# Patient Record
Sex: Female | Born: 1965
Health system: Southern US, Community
[De-identification: ages and names within clinical notes are randomized; demographics above are authoritative.]

## PROBLEM LIST (undated history)

## (undated) DIAGNOSIS — E049 Nontoxic goiter, unspecified: Secondary | ICD-10-CM

## (undated) DIAGNOSIS — E119 Type 2 diabetes mellitus without complications: Secondary | ICD-10-CM

## (undated) HISTORY — DX: Type 2 diabetes mellitus without complications: E11.9

## (undated) HISTORY — DX: Nontoxic goiter, unspecified: E04.9

---

## 2020-05-05 ENCOUNTER — Encounter: Payer: Self-pay | Admitting: Family Medicine

## 2020-05-05 DIAGNOSIS — Z0289 Encounter for other administrative examinations: Secondary | ICD-10-CM | POA: Insufficient documentation

## 2020-05-07 ENCOUNTER — Ambulatory Visit (INDEPENDENT_AMBULATORY_CARE_PROVIDER_SITE_OTHER): Payer: Medicaid Other | Admitting: Family Medicine

## 2020-05-07 ENCOUNTER — Other Ambulatory Visit: Payer: Self-pay

## 2020-05-07 VITALS — BP 122/82 | HR 81 | Ht 60.5 in | Wt 133.8 lb

## 2020-05-07 DIAGNOSIS — Z0289 Encounter for other administrative examinations: Secondary | ICD-10-CM

## 2020-05-07 DIAGNOSIS — E119 Type 2 diabetes mellitus without complications: Secondary | ICD-10-CM | POA: Diagnosis not present

## 2020-05-07 DIAGNOSIS — E049 Nontoxic goiter, unspecified: Secondary | ICD-10-CM

## 2020-05-07 DIAGNOSIS — Z1211 Encounter for screening for malignant neoplasm of colon: Secondary | ICD-10-CM | POA: Diagnosis not present

## 2020-05-07 DIAGNOSIS — E1140 Type 2 diabetes mellitus with diabetic neuropathy, unspecified: Secondary | ICD-10-CM | POA: Insufficient documentation

## 2020-05-07 LAB — POCT URINALYSIS DIP (MANUAL ENTRY)
Bilirubin, UA: NEGATIVE
Blood, UA: NEGATIVE
Glucose, UA: 250 mg/dL — AB
Ketones, POC UA: NEGATIVE mg/dL
Nitrite, UA: NEGATIVE
Protein Ur, POC: NEGATIVE mg/dL
Spec Grav, UA: 1.025 (ref 1.010–1.025)
Urobilinogen, UA: 0.2 E.U./dL
pH, UA: 5.5 (ref 5.0–8.0)

## 2020-05-07 LAB — POCT UA - MICROSCOPIC ONLY

## 2020-05-07 LAB — POCT GLYCOSYLATED HEMOGLOBIN (HGB A1C): HbA1c, POC (controlled diabetic range): 9.8 % — AB (ref 0.0–7.0)

## 2020-05-07 MED ORDER — DAPAGLIFLOZIN PROPANEDIOL 5 MG PO TABS: 5.0000 mg | ORAL_TABLET | Freq: Every day | ORAL | 1 refills | Status: DC

## 2020-05-07 MED ORDER — METFORMIN HCL ER 500 MG PO TB24: 500.0000 mg | ORAL_TABLET | Freq: Every day | ORAL | 1 refills | Status: DC

## 2020-05-07 NOTE — Progress Notes (Signed)
Patient Name: Shelley Bartlett Date of Birth: 1965/11/18 Date of Visit: 05/07/20 PCP: Cleophas Dunker, DO  Chief Complaint: refugee intake examination and diabetes, thyroid pain   The patient's preferred language is Swahili. An interpreter was used for the entire visit.  Interpreter Name or ID: Jocelyne, in-person interpretor    Subjective: Shelley Bartlett is a pleasant 54 y.o. presenting today for an initial refugee and immigrant clinic visit.    ROS: + Foot pain, thyroid pain, eye pain.  No fevers, chills, weight change.  No cough, shortness of breath, chest pain.  No diarrhea, vomiting.  No rashes.     PMH: T2DM, diagnosed 7 years ago, A1c toay 9.8, at refugee health exam was 13.2 Has been taking Daonil '5mg'$  BID, Metformin 875 mg BID Has tolerated metformin well, just gets a little dizzy with it Has a glucometer, does not check sugars regularly No hypoglycemia No polyuria, polydipsia  Goiter, has been enlarged 3 years No pain, no difficulty swallowing No changes in voice No excessive sweating, no excessive fatigue Occasionally tired, but not all the time No dry skin or brittle hair    Eye Problem: States that she needed surgery but isn't sure what for Reports history of poor vision  Does not think she has ever had a pap  PSH: None   FH: Denies family history of medical problems in parents or children  Current Medications:  Metformin 875 BID Daonil '5mg'$  BID   Refugee Information Number of Immediate Family Members: 6 Number of Immediate Family Members in [place]: 6 Date of Arrival: 04/25/20 Country of Birth: [place] of Origin: [place]: Other Other Location of [place]:: [place] Duration in [place]: 20 years or greater Reason for [place]: Other Other Reason for [place]:: [place] Primary Language: Swahili/Kiswahili Able to Read in Primary Language: Yes Able to Write in Primary Language:  Yes Education: [employer] Prior Work: Medical sales representative Marital Status: Married Sexual Activity: Yes Tuberculosis Midwife: Not Completed Tuberculosis Screening Health Department: Not Completed Health Department Labs Completed: No Do You Feel Jumpy or Nervous?: No Are You Very Watchful or 'Super Alert'?: No  Date of [place] Exam: 07/11/2019 Review of [place] Exam: 05/07/2020 Pre-Departure Treatment: See attestation    Vitals:   05/07/20 0950  BP: 122/82  Pulse: 81  SpO2: 100%   HEENT: Sclera anicteric. Dentition is poor . Appears well hydrated. Neck: Supple, diffuse, non-tender thyromegaly, no cervical LAD Cardiac: Regular rate and rhythm. Normal S1/S2. No murmurs, rubs, or gallops appreciated. Lungs: Clear bilaterally to ascultation.  Abdomen: Normoactive bowel sounds. No tenderness to deep or light palpation. No rebound or guarding. No Splenomegaly.  Extremities: Warm, well perfused without edema.  Skin: warm, dry  Psych: Pleasant and appropriate  MSK: moving all extremities equally, no edema   [name] was seen today for establish care.  Diagnoses and all orders for this visit:  Refugee health examination -     POCT urinalysis dipstick -     RPR -     HIV Antibody (routine testing w rflx) -     Hepatitis c antibody (reflex) -     CBC with Differential/Platelet -     Comprehensive metabolic panel -     Varicella zoster antibody, IgG -     Hepatitis B surface antigen -     Hepatitis B core antibody, total -     Hepatitis B surface antibody,quantitative -     Lipid panel -  TSH -     HCV Ab w Reflex to Quant PCR -     Hgb Fractionation Cascade -     Cancel: Strongyloides antibody -     Strongyloides, Ab, IgG -     POCT UA - Microscopic Only  Type 2 diabetes mellitus without complication, unspecified whether long term insulin use (HCC) -     POCT glycosylated hemoglobin (Hb A1C) -     POCT UA - Microscopic Only -     Ambulatory referral to Ophthalmology -      metFORMIN (GLUCOPHAGE-XR) 500 MG 24 hr tablet; Take 1 tablet (500 mg total) by mouth daily with breakfast. -     dapagliflozin propanediol (FARXIGA) 5 MG TABS tablet; Take 1 tablet (5 mg total) by mouth daily.  Goiter  Screen for colon cancer -     Ambulatory referral to Gastroenterology   Type 2 diabetes mellitus without complication (HCC) R0Q today 9.8.  UA with 250 glucose.  Has been on Metformin 875 mg BID and sulfonylurea.  Preferred for medicaid is farxiga or jardiance.  Will start with faxiga '5mg'$  QD and start metformin XR '500mg'$  as she has dizziness with 875.  If able, will titrate up.  Can also titrate farxiga to '10mg'$  if needed.  BMP checked today with refugee labs.  Return in 1 week for repeat BMP and CBG follow up.  Advised to discontinue SGLT2 inhibitor use when having GI illness and to return to care if symptoms of yeast infection.  Patient has a glucometer.  Referral placed to ophthalmology.  Will send message to social worker to help with scheduling appointment.  Plan for foot exam in the future.  Goiter Noted on exam and seen on history.  TSH 0.56, T3 4.5, T4 15.4 at [facility] exam, all WNL.  Will perform POC [place] at visit in 1 week then order formal [place].  Will also repeat TSH today with refugee health labs.  Refugee health examination History reviewed and updated.  Refugee health information also updated.  Will need to assess history of trauma at follow up visit when patient is alone, as her husband was present for entirety of examination.  Labs obtained.  Needs to go to health department for TB testing.  Immunizations at follow up visit.  Screen for colon cancer Colonoscopy ordered.  Will send information to social worker to help with scheduling for patient.     Return to care in 1 week in [facility] with PCP.  Patient will need a pap smear as well for health maintenance at a follow up visit.  Vaccines: At follow up visit   Labs to Order if not completed at Health Department:   -  CBC with differential (looking for eosinophilia) - CMET  - HIV - Hepatitis C for adults, + tattoo, substance use, other risk factors  - RPR (those 15 and over)  - Varicella IgG if 19 years or older, unless immunization documented - MMR titer (unless prior vaccination)  - Hepatitis B surface antigen, surface antibody, and core antibody  - GC/CT if sexually active or risk factors  - Quantiferon or TST  - Lipids - HbA1C if overweight or obese  - B12 if [place] or [place]  - Sickle cell screen  - Urine dipstick  - Age 87-16, lead (repeat 3-6 months later)

## 2020-05-07 NOTE — Assessment & Plan Note (Signed)
Colonoscopy ordered.  Will send information to social worker to help with scheduling for patient.

## 2020-05-07 NOTE — Patient Instructions (Addendum)
Thank you for coming to see me today. It was a pleasure to meet you. Today we talked about:   Your diabetes: we will continue your metformin and start you on Farxiga 5 mg.  Come back in 4-6 weeks with a list of your blood sugars and we will repeat labs at that time.  You should stop taking this medication if you get sick with vomiting or diarrhea and resume it when you feel better again.  Check your sugars at least once a day (preferably in the morning).  You should go to the health department for your TB testing.  Your social worker will help you schedule your colonoscopy and your eye doctor.  Please follow-up with me in 1 week.  If you have any questions or concerns, please do not hesitate to call the office at [phone].  Best,   Luis Abed, DO

## 2020-05-07 NOTE — Assessment & Plan Note (Addendum)
History reviewed and updated.  Refugee health information also updated.  Will need to assess history of trauma at follow up visit when patient is alone, as her husband was present for entirety of examination.  Labs obtained.  Needs to go to health department for TB testing.  Immunizations at follow up visit.

## 2020-05-07 NOTE — Assessment & Plan Note (Addendum)
Noted on exam and seen on history.  TSH 0.56, T3 4.5, T4 15.4 at [facility] exam, all WNL.  Will perform POC [place] at visit in 1 week then order formal [place].  Will also repeat TSH today with refugee health labs.

## 2020-05-07 NOTE — Assessment & Plan Note (Addendum)
A1c today 9.8.  UA with 250 glucose.  Has been on Metformin 875 mg BID and sulfonylurea.  Preferred for medicaid is farxiga or jardiance.  Will start with faxiga 5mg  QD and start metformin XR 500mg  as she has dizziness with 875.  If able, will titrate up.  Can also titrate farxiga to 10mg  if needed.  BMP checked today with refugee labs.  Return in 1 week for repeat BMP and CBG follow up.  Advised to discontinue SGLT2 inhibitor use when having GI illness and to return to care if symptoms of yeast infection.  Patient has a glucometer.  Referral placed to ophthalmology.  Will send message to social worker to help with scheduling appointment.  Plan for foot exam in the future.

## 2020-05-09 LAB — CBC WITH DIFFERENTIAL/PLATELET
Basophils Absolute: 0 10*3/uL (ref 0.0–0.2)
Basos: 0 %
EOS (ABSOLUTE): 0 10*3/uL (ref 0.0–0.4)
Eos: 0 %
Hematocrit: 41.9 % (ref 34.0–46.6)
Hemoglobin: 13.5 g/dL (ref 11.1–15.9)
Immature Grans (Abs): 0 10*3/uL (ref 0.0–0.1)
Immature Granulocytes: 0 %
Lymphocytes Absolute: 2 10*3/uL (ref 0.7–3.1)
Lymphs: 40 %
MCH: 26.8 pg (ref 26.6–33.0)
MCHC: 32.2 g/dL (ref 31.5–35.7)
MCV: 83 fL (ref 79–97)
Monocytes Absolute: 0.4 10*3/uL (ref 0.1–0.9)
Monocytes: 7 %
Neutrophils Absolute: 2.6 10*3/uL (ref 1.4–7.0)
Neutrophils: 53 %
Platelets: 170 10*3/uL (ref 150–450)
RBC: 5.03 x10E6/uL (ref 3.77–5.28)
RDW: 13.8 % (ref 11.7–15.4)
WBC: 5 10*3/uL (ref 3.4–10.8)

## 2020-05-09 LAB — COMPREHENSIVE METABOLIC PANEL
ALT: 23 IU/L (ref 0–32)
AST: 19 IU/L (ref 0–40)
Albumin/Globulin Ratio: 1.7 (ref 1.2–2.2)
Albumin: 4.4 g/dL (ref 3.8–4.9)
Alkaline Phosphatase: 85 IU/L (ref 48–121)
BUN/Creatinine Ratio: 20 (ref 9–23)
BUN: 12 mg/dL (ref 6–24)
Bilirubin Total: 0.3 mg/dL (ref 0.0–1.2)
CO2: 24 mmol/L (ref 20–29)
Calcium: 9.3 mg/dL (ref 8.7–10.2)
Chloride: 100 mmol/L (ref 96–106)
Creatinine, Ser: 0.6 mg/dL (ref 0.57–1.00)
GFR calc Af Amer: 121 mL/min/{1.73_m2} (ref >59)
GFR calc non Af Amer: 105 mL/min/{1.73_m2} (ref >59)
Globulin, Total: 2.6 g/dL (ref 1.5–4.5)
Glucose: 234 mg/dL — ABNORMAL HIGH (ref 65–99)
Potassium: 4.4 mmol/L (ref 3.5–5.2)
Sodium: 139 mmol/L (ref 134–144)
Total Protein: 7 g/dL (ref 6.0–8.5)

## 2020-05-09 LAB — HGB FRACTIONATION CASCADE
Hgb A2: 2.6 % (ref 1.8–3.2)
Hgb A: 96.8 % (ref 96.4–98.8)
Hgb F: 0.6 % (ref 0.0–2.0)
Hgb S: 0 %

## 2020-05-09 LAB — HEPATITIS B SURFACE ANTIBODY, QUANTITATIVE: Hepatitis B Surf Ab Quant: >1000 m[IU]/mL (ref >9.9)

## 2020-05-09 LAB — LIPID PANEL
Chol/HDL Ratio: 3.5 ratio (ref 0.0–4.4)
Cholesterol, Total: 166 mg/dL (ref 100–199)
HDL: 47 mg/dL (ref >39)
LDL Chol Calc (NIH): 103 mg/dL — ABNORMAL HIGH (ref 0–99)
Triglycerides: 84 mg/dL (ref 0–149)
VLDL Cholesterol Cal: 16 mg/dL (ref 5–40)

## 2020-05-09 LAB — HEPATITIS B CORE ANTIBODY, TOTAL: Hep B Core Total Ab: NEGATIVE

## 2020-05-09 LAB — HCV AB W REFLEX TO QUANT PCR: HCV Ab: <0.1 s/co ratio (ref 0.0–0.9)

## 2020-05-09 LAB — HCV INTERPRETATION

## 2020-05-09 LAB — RPR: RPR Ser Ql: NONREACTIVE

## 2020-05-09 LAB — HEPATITIS B SURFACE ANTIGEN: Hepatitis B Surface Ag: NEGATIVE

## 2020-05-09 LAB — VARICELLA ZOSTER ANTIBODY, IGG: Varicella zoster IgG: 158 index — ABNORMAL LOW (ref >165)

## 2020-05-09 LAB — TSH: TSH: 0.913 u[IU]/mL (ref 0.450–4.500)

## 2020-05-09 LAB — HIV ANTIBODY (ROUTINE TESTING W REFLEX): HIV Screen 4th Generation wRfx: NONREACTIVE

## 2020-05-10 LAB — STRONGYLOIDES, AB, IGG: Strongyloides, Ab, IgG: NEGATIVE

## 2020-05-13 NOTE — Progress Notes (Signed)
    SUBJECTIVE:   CHIEF COMPLAINT / HPI:   DM Patient was prescribed Farxiga and Metformin XR 500 mg at last visit She has been taking Metformin, did not have Medicaid card with her and Marcelline Deist was going to be $600 She has been tolerating metformin well  CBGs has not checked BMP was within normal limits  Varicella nonimmune Patient is not immune to varicella, she will need a varicella vaccination Not a problem for patient to get vaccination  Goiter Patient has a history of goiter TSH was within normal limits She is asymptomatic  Foot pain 1 year Soaking her legs and feet in warm water helps They hurt all the time When she wears socks it helps with pain Wearing closed toe shoes makes the pain worse, wears sandals Pain is mostly in the balls of her feet  No specific time of day Describes the pain as burning pain Hard for her to walk on cement  iPad [place]il interpretor used for entirety of encounter  PERTINENT  PMH / PSH: T2DM, recent refugee  OBJECTIVE:   BP 120/76   Pulse 83   Ht 5' 0.5" (1.537 m)   Wt 135 lb (61.2 kg)   SpO2 98%   BMI 25.93 kg/m    Physical Exam:  General: 54 y.o. female in NAD Neck: Visible goiter Lungs: Breathing comfortably on room air Skin: warm and dry Extremities: No edema  Diabetic Foot Check -  Appearance - no lesions, ulcers or calluses Skin - no unusual pallor or redness Monofilament testing - WNL aside from Decreased sensation to monofilament testing on plantar aspect of right foot along all points at MTP joints, on left, decreased sensation to monofilament testing only at 1st MTP Right - Great toe, medial, central, lateral ball and posterior foot intact Left - Great toe, medial, central, lateral ball and posterior foot intact     ASSESSMENT/PLAN:   Type 2 diabetes mellitus with diabetic neuropathy, without long-term current use of insulin (HCC) Asked patient to check CBGs every morning.  Given that she has been without  [place] and her A1c was 9.8 at last visit, will go ahead and increase Metformin to 1000 mg daily.  Patient instructed to take 2 tablets daily.  Have been in contact with social worker and advised that patients should take Medicaid card to the pharmacy so that prior authorization can be started for [place].  Come back on 8/2.  Patient's foot pain is likely consistent with diabetic neuropathy given the consistency, although it is difficult to get a history.  She has decreased sensation in the areas as well which is likely related to diabetic neuropathy.  We will start with gabapentin 100 mg twice daily, can increase as patient tolerates and needs.  Goiter TSH within normal limits.  We will go ahead and order ultrasound for patient.  This was scheduled and patient was given date and time with instructions on how to get there.  Unknown varicella vaccination status Varicella nonimmune on initial refugee labs.  Patient will need varicella immunization.  Plan for immunizations at next visit.     Unknown Jim, DO [facility]

## 2020-05-14 ENCOUNTER — Encounter: Payer: Self-pay | Admitting: Family Medicine

## 2020-05-14 ENCOUNTER — Ambulatory Visit (INDEPENDENT_AMBULATORY_CARE_PROVIDER_SITE_OTHER): Payer: Medicaid Other | Admitting: Family Medicine

## 2020-05-14 ENCOUNTER — Other Ambulatory Visit: Payer: Self-pay

## 2020-05-14 ENCOUNTER — Telehealth: Payer: Self-pay | Admitting: *Deleted

## 2020-05-14 VITALS — BP 120/76 | HR 83 | Ht 60.5 in | Wt 135.0 lb

## 2020-05-14 DIAGNOSIS — Z789 Other specified health status: Secondary | ICD-10-CM | POA: Diagnosis not present

## 2020-05-14 DIAGNOSIS — E1140 Type 2 diabetes mellitus with diabetic neuropathy, unspecified: Secondary | ICD-10-CM | POA: Diagnosis not present

## 2020-05-14 DIAGNOSIS — E119 Type 2 diabetes mellitus without complications: Secondary | ICD-10-CM | POA: Diagnosis present

## 2020-05-14 DIAGNOSIS — E049 Nontoxic goiter, unspecified: Secondary | ICD-10-CM | POA: Diagnosis not present

## 2020-05-14 MED ORDER — METFORMIN HCL ER 500 MG PO TB24: 1000.0000 mg | ORAL_TABLET | Freq: Every day | ORAL | 2 refills | Status: DC

## 2020-05-14 MED ORDER — GABAPENTIN 100 MG PO CAPS: 100.0000 mg | ORAL_CAPSULE | Freq: Two times a day (BID) | ORAL | 3 refills | Status: DC

## 2020-05-14 NOTE — Telephone Encounter (Signed)
Prior approval for [place] completed via [employer].  Med approved for 05/14/2020 - 05/14/2021.  [facility] pharmacy informed via VM.  Jone Baseman, CMA

## 2020-05-14 NOTE — Assessment & Plan Note (Signed)
TSH within normal limits.  We will go ahead and order ultrasound for patient.  This was scheduled and patient was given date and time with instructions on how to get there.

## 2020-05-14 NOTE — Assessment & Plan Note (Signed)
Varicella nonimmune on initial refugee labs.  Patient will need varicella immunization.  Plan for immunizations at next visit.

## 2020-05-14 NOTE — Patient Instructions (Addendum)
Thank you for coming to see me today. It was a pleasure. Today we talked about:   We are working on getting one of your diabetes medications for you, Marcelline Deist.    Start taking two tablets of metformin once a day.  Please start checking your blood sugars every morning with your monitor.  You can take gabapentin for your foot pain.  Take it twice a day.  If it isn't helping enough, but you aren't getting too sleepy, we can increase it.  Please follow-up with me on 8/2 at 1:30pm.  If you have any questions or concerns, please do not hesitate to call the office at [phone].  Best,   Luis Abed, DO

## 2020-05-14 NOTE — Telephone Encounter (Signed)
Completed PA info in [employer] for [place].  Status pending.  Will recheck status in 24 hours. Jone Baseman, CMA

## 2020-05-14 NOTE — Assessment & Plan Note (Addendum)
Asked patient to check CBGs every morning.  Given that she has been without [place] and her A1c was 9.8 at last visit, will go ahead and increase Metformin to 1000 mg daily.  Patient instructed to take 2 tablets daily.  Have been in contact with social worker and advised that patients should take Medicaid card to the pharmacy so that prior authorization can be started for [place].  Come back on 8/2.  Patient's foot pain is likely consistent with diabetic neuropathy given the consistency, although it is difficult to get a history.  She has decreased sensation in the areas as well which is likely related to diabetic neuropathy.  We will start with gabapentin 100 mg twice daily, can increase as patient tolerates and needs.

## 2020-05-22 ENCOUNTER — Other Ambulatory Visit: Payer: Self-pay

## 2020-05-22 ENCOUNTER — Ambulatory Visit (HOSPITAL_COMMUNITY)
Admission: RE | Admit: 2020-05-22 | Discharge: 2020-05-22 | Disposition: A | Discharge status: Home / Self Care | Payer: Medicaid Other | Source: Ambulatory Visit | Attending: Family Medicine | Admitting: Family Medicine

## 2020-05-22 DIAGNOSIS — E049 Nontoxic goiter, unspecified: Secondary | ICD-10-CM | POA: Insufficient documentation

## 2020-06-03 ENCOUNTER — Ambulatory Visit (INDEPENDENT_AMBULATORY_CARE_PROVIDER_SITE_OTHER): Payer: Medicaid Other | Admitting: Family Medicine

## 2020-06-03 ENCOUNTER — Other Ambulatory Visit: Payer: Self-pay

## 2020-06-03 VITALS — BP 120/70 | HR 86 | Ht 61.0 in | Wt 133.8 lb

## 2020-06-03 DIAGNOSIS — E1140 Type 2 diabetes mellitus with diabetic neuropathy, unspecified: Secondary | ICD-10-CM | POA: Diagnosis not present

## 2020-06-03 DIAGNOSIS — E041 Nontoxic single thyroid nodule: Secondary | ICD-10-CM | POA: Diagnosis not present

## 2020-06-03 MED ORDER — GABAPENTIN 300 MG PO CAPS: 300.0000 mg | ORAL_CAPSULE | Freq: Two times a day (BID) | ORAL | 1 refills | Status: DC

## 2020-06-03 NOTE — Assessment & Plan Note (Signed)
Order placed for biopsy.  Patient's phone number verified.  Advised that she will be contacted for scheduling.

## 2020-06-03 NOTE — Assessment & Plan Note (Signed)
Not taking Metformin 1000 mg daily and Farxiga Has not been consistently taking CBGs.  We will go ahead and obtain BMP today given that she started [place] after last visit.  We will also increase gabapentin to 300 mg twice daily for neuropathy given that she has not had controlled pain, but is also not having somnolence.  Creatinine was stable at last check.  Advised for her to bring her home medications with her to her next visit and to continue to check CBGs.

## 2020-06-03 NOTE — Progress Notes (Signed)
° ° °  SUBJECTIVE:   CHIEF COMPLAINT / HPI:   Thyroid Nodule Patient had thyroid ultrasound which recommended referral for biopsy of left mid thyroid nodule Had attempted to call patient to discuss these results prior to her visit, left voicemail, did not get a call back TSH has previously been within normal limits on check Verified phone number   Diabetes Current regimen: Metformin 1000mg  and Farxiga CBGs: 148 this AM, has been forgetting Started on gabapentin 100mg  QHS for neuropathy at last visit States that her neuropathy pain has not improved, does state that she does not have drowsiness from gabapentin and would like to increase the dose to see if this would help  Vaccinations Patient needs refugee health vaccinations  She is varicella non-immune and will need varicella vaccination Got vacciantions last week and didn't bring list  PERTINENT  PMH / PSH: T2DM, neuropathy, thyroid nodule, recent refugee  OBJECTIVE:   BP 120/70    Pulse 86    Ht 5\' 1"  (1.549 m)    Wt 133 lb 12.8 oz (60.7 kg)    SpO2 98%    BMI 25.28 kg/m    Physical Exam:  General: 54 y.o. female in NAD Lungs: Breathing comfortably on room air Skin: warm and dry Extremities: Ambulating without difficulty   ASSESSMENT/PLAN:   Type 2 diabetes mellitus with diabetic neuropathy, without long-term current use of insulin (HCC) Not taking Metformin 1000 mg daily and Farxiga Has not been consistently taking CBGs.  We will go ahead and obtain BMP today given that she started after last visit.  We will also increase gabapentin to 300 mg twice daily for neuropathy given that she has not had controlled pain, but is also not having somnolence.  Creatinine was stable at last check.  Advised for her to bring her home medications with her to her next visit and to continue to check CBGs.  Thyroid nodule Order placed for biopsy.  Patient's phone number verified.  Advised that she will be contacted for scheduling.      Patient and her husband report that they received vaccinations last week and would like to wait until her next visit to bring their vaccination records with them to see what they need  Swahili interpreter used for entirety of encounter  , DO [facility]

## 2020-06-03 NOTE — Patient Instructions (Addendum)
Thank you for coming to see me today. It was a pleasure. Today we talked about:   Bring your vaccinations and medications back next time.    Someone will call you with the time for your thyroid biopsy.    Please follow-up with me on 9/7 at 1:30 pm  If you have any questions or concerns, please do not hesitate to call the office at [phone].  Best,   Luis Abed, DO

## 2020-06-04 LAB — BASIC METABOLIC PANEL
BUN/Creatinine Ratio: 25 — ABNORMAL HIGH (ref 9–23)
BUN: 15 mg/dL (ref 6–24)
CO2: 20 mmol/L (ref 20–29)
Calcium: 9.4 mg/dL (ref 8.7–10.2)
Chloride: 103 mmol/L (ref 96–106)
Creatinine, Ser: 0.61 mg/dL (ref 0.57–1.00)
GFR calc Af Amer: 121 mL/min/{1.73_m2} (ref >59)
GFR calc non Af Amer: 105 mL/min/{1.73_m2} (ref >59)
Glucose: 256 mg/dL — ABNORMAL HIGH (ref 65–99)
Potassium: 4.3 mmol/L (ref 3.5–5.2)
Sodium: 137 mmol/L (ref 134–144)

## 2020-06-12 ENCOUNTER — Telehealth: Payer: Self-pay | Admitting: *Deleted

## 2020-06-12 NOTE — Telephone Encounter (Signed)
Checked appointments and this is scheduled.Zayana Salvador Zimmerman Rumple, CMA

## 2020-06-12 NOTE — Telephone Encounter (Signed)
-----   Message from Unknown Jim, DO sent at 06/03/2020  9:34 PM EDT ----- Can we please call and make sure that someone called this patient to schedule the biopsy?  Thanks!

## 2020-06-20 ENCOUNTER — Other Ambulatory Visit (HOSPITAL_COMMUNITY)
Admission: RE | Admit: 2020-06-20 | Discharge: 2020-06-20 | Disposition: A | Discharge status: Home / Self Care | Payer: Medicaid Other | Source: Ambulatory Visit | Attending: Student | Admitting: Student

## 2020-06-20 ENCOUNTER — Ambulatory Visit
Admission: RE | Admit: 2020-06-20 | Discharge: 2020-06-20 | Disposition: A | Discharge status: Home / Self Care | Payer: Medicaid Other | Source: Ambulatory Visit | Attending: Family Medicine | Admitting: Family Medicine

## 2020-06-20 DIAGNOSIS — E041 Nontoxic single thyroid nodule: Secondary | ICD-10-CM

## 2020-06-21 LAB — CYTOLOGY - NON PAP

## 2020-07-09 ENCOUNTER — Ambulatory Visit: Payer: Medicaid Other | Admitting: Family Medicine

## 2020-07-11 ENCOUNTER — Other Ambulatory Visit: Payer: Self-pay

## 2020-07-11 ENCOUNTER — Ambulatory Visit: Payer: Medicaid Other | Admitting: Family Medicine

## 2020-07-11 VITALS — BP 122/72 | HR 74 | Ht 61.0 in | Wt 130.6 lb

## 2020-07-11 DIAGNOSIS — E1140 Type 2 diabetes mellitus with diabetic neuropathy, unspecified: Secondary | ICD-10-CM

## 2020-07-11 DIAGNOSIS — M545 Low back pain, unspecified: Secondary | ICD-10-CM

## 2020-07-11 DIAGNOSIS — E041 Nontoxic single thyroid nodule: Secondary | ICD-10-CM

## 2020-07-11 MED ORDER — LIDOCAINE 5 % EX PTCH: 1.0000 | MEDICATED_PATCH | CUTANEOUS | 0 refills | Status: DC

## 2020-07-11 NOTE — Progress Notes (Signed)
SUBJECTIVE:   CHIEF COMPLAINT / HPI:   Thyroid nodule Patient had thyroid biopsy on 8/19 of nodule, consistent with benign follicular nodule Bethesda category II She is not having any pain or problems from this  Type 2 diabetes Current regimen: Metformin 1000 mg daily and Farxiga Has not been taking metformin for the last week because she saw that she was getting low on her medication and didn't want to run out CBGs 171 yesterday, then AM was 228, she states that before this, her sugars were going down Lowest CBG was 95  Neuropathy Patient was started on gabapentin at visit on 7/13, increased on 8/2 to 300 mg twice daily Continues to have symptoms in her fingers and her feet Feels like her feet are sore and it is difficult to walk  Back Pain 2 weeks Lower back No radiation to the legs Has never had this before Achey pain Massages makes it feel  Sitting for a long period makes it hurt more No changes in bowel or bladder function No N/T in legs   Received 1st COVID vaccination [facility] at [facility]  iPad interpreter used for entirety of encounter  PERTINENT  PMH / PSH: T2DM, Recent refugee, Goiter with nodule  OBJECTIVE:   BP 122/72   Pulse 74   Ht 5\' 1"  (1.549 m)   Wt 130 lb 9.6 oz (59.2 kg)   SpO2 99%   BMI 24.68 kg/m    Physical Exam:  General: 54 y.o. female in NAD Lungs: Breathing comfortably on room air Skin: warm and dry Extremities: No edema  Back exam: Inspection: No gross deformities, does have slightly increased lumbar lordosis, no overlying rashes or erythema Palpation: No tenderness palpation of spinous processes, no step-off noted in the lumbar region, mild tenderness to palpation of right paraspinal musculature and lumbar region ROM: Full range of motion in all fields, able to touch the ground with flexion, mild pain with extension Strength: 5/5 strength BLE Special Tests: negative stork sign bilaterally Neurovascular: Intact  distally   ASSESSMENT/PLAN:   Type 2 diabetes mellitus with diabetic neuropathy, without long-term current use of insulin (HCC) Last A1c 9.8 on 7/6.  Has been taking Metformin and Farxiga, but has been without Metformin for at least a week.  Reports that when she had Metformin, her sugars were better controlled.  CBG has been 95.  For now, we will continue with Metformin and 9/6.  Had BMP at last visit.  Continue to check CBGs.  Will check A1c at next visit in 1 month to help further optimize treatment.  Patient has not been increasing gabapentin to twice daily, therefore explained at length to her about taking it twice a day and drew a picture of a sun and moon on the side of the bottle.  Also explained to patient at length how refills work and that medications can be obtained from the pharmacy when she runs out.  Thyroid nodule Discussed with patient that findings were benign and the plan is to repeat TSH and ultrasound in 1 year.  Acute bilateral low back pain without sciatica Does not appear to have any signs or symptoms of cauda equina.  Most likely this pain is secondary to degenerative disc given that it worsens after sitting for long periods of time.  Her exam is very reassuring, strength and sensation are intact in her bilateral lower extremities and she has excellent range of motion.  This is also been a short time that she has been  having pain and it does not seem to be limiting her activities.  We will treat with Tylenol over-the-counter as needed and lidocaine patch.  She was advised to use 1 lidocaine patch per day.   Return in 1 month.  Appointment made prior to leaving room.  Unknown Jim, DO [facility]

## 2020-07-11 NOTE — Patient Instructions (Signed)
Thank you for coming to see me today. It was a pleasure. Today we talked about:   Take your diabetes medication every day.  If you run out, go to the pharmacy.  Continue to check your sugars and write them down.  You can take gabapentin in the morning and night.  We can increase if needed.    Bring back your vaccinations.  Tylenol can also be used for your pain andyou can use a lidocaine patch once a day.  Please follow-up with me in 1 month.  If you have any questions or concerns, please do not hesitate to call the office at [phone].  Best,   Luis Abed, DO

## 2020-07-12 ENCOUNTER — Telehealth: Payer: Self-pay

## 2020-07-12 DIAGNOSIS — M545 Low back pain, unspecified: Secondary | ICD-10-CM | POA: Insufficient documentation

## 2020-07-12 NOTE — Assessment & Plan Note (Addendum)
Last A1c 9.8 on 7/6.  Has been taking Metformin and Farxiga, but has been without Metformin for at least a week.  Reports that when she had Metformin, her sugars were better controlled.  CBG has been 95.  For now, we will continue with Metformin and [place].  Had BMP at last visit.  Continue to check CBGs.  Will check A1c at next visit in 1 month to help further optimize treatment.  Patient has not been increasing gabapentin to twice daily, therefore explained at length to her about taking it twice a day and drew a picture of a sun and moon on the side of the bottle.  Also explained to patient at length how refills work and that medications can be obtained from the pharmacy when she runs out.

## 2020-07-12 NOTE — Telephone Encounter (Signed)
Completed PA info in [facility] Tracks for Lidocaine Patches.This has been approved and the pharmacy has been notified.

## 2020-07-12 NOTE — Assessment & Plan Note (Signed)
Discussed with patient that findings were benign and the plan is to repeat TSH and ultrasound in 1 year.

## 2020-07-12 NOTE — Assessment & Plan Note (Signed)
Does not appear to have any signs or symptoms of cauda equina.  Most likely this pain is secondary to degenerative disc given that it worsens after sitting for long periods of time.  Her exam is very reassuring, strength and sensation are intact in her bilateral lower extremities and she has excellent range of motion.  This is also been a short time that she has been having pain and it does not seem to be limiting her activities.  We will treat with Tylenol over-the-counter as needed and lidocaine patch.  She was advised to use 1 lidocaine patch per day.

## 2020-07-19 ENCOUNTER — Telehealth: Payer: Self-pay | Admitting: Family Medicine

## 2020-07-19 NOTE — Telephone Encounter (Signed)
Shelley Bartlett was called with [employer] and I spoke with her and her husband.  She was informed that lab our GI had made multiple attempts to try and contact her to schedule her colonoscopy.  She was given the phone number to contact live our GI to set up her colonoscopy.

## 2020-08-14 ENCOUNTER — Ambulatory Visit (INDEPENDENT_AMBULATORY_CARE_PROVIDER_SITE_OTHER): Payer: Medicaid Other | Admitting: Family Medicine

## 2020-08-14 ENCOUNTER — Other Ambulatory Visit: Payer: Self-pay

## 2020-08-14 ENCOUNTER — Encounter: Payer: Self-pay | Admitting: Family Medicine

## 2020-08-14 VITALS — BP 108/62 | HR 81 | Ht 61.0 in | Wt 132.2 lb

## 2020-08-14 DIAGNOSIS — E1140 Type 2 diabetes mellitus with diabetic neuropathy, unspecified: Secondary | ICD-10-CM

## 2020-08-14 LAB — POCT GLYCOSYLATED HEMOGLOBIN (HGB A1C): HbA1c, POC (controlled diabetic range): 9.7 % — AB (ref 0.0–7.0)

## 2020-08-14 MED ORDER — ACCU-CHEK GUIDE W/DEVICE KIT: 1.0000 | PACK | Freq: Once | 0 refills | Status: AC

## 2020-08-14 MED ORDER — ATORVASTATIN CALCIUM 20 MG PO TABS: 20.0000 mg | ORAL_TABLET | Freq: Every day | ORAL | 3 refills | Status: DC

## 2020-08-14 MED ORDER — DAPAGLIFLOZIN PROPANEDIOL 10 MG PO TABS: 10.0000 mg | ORAL_TABLET | Freq: Every day | ORAL | 1 refills | Status: DC

## 2020-08-14 NOTE — Progress Notes (Signed)
    SUBJECTIVE:   CHIEF COMPLAINT / HPI:   T2DM Current regimen: Metformin 1000 mg daily and Farxiga She reports compliance CBG has not been able to check due to meter not working for one month Last hemoglobin A1c 9.8 on 7/6 She is not on statin or ACE/ARB therapy at this time   Neuropathy At last visit was educated on taking gabapentin twice daily States that she has been taking gabapentin like advised Warm water makes it better Gabapentin does not make her sleepy,s he would be open to increasing She has trouble standing on her feet  [place] iPad interpretor used for entirety of encounter  PERTINENT  PMH / PSH: T2DM, recent refugee, thyroid goiter with nodule  OBJECTIVE:   BP 108/62   Pulse 81   Ht 5\' 1"  (1.549 m)   Wt 132 lb 3.2 oz (60 kg)   SpO2 98%   BMI 24.98 kg/m    Physical Exam:  General: 54 y.o. female in NAD Lungs: Breathing comfortably on room air Skin: warm and dry Extremities: No edema  Results for orders placed or performed in visit on 08/14/20 (from the past 24 hour(s))  POCT glycosylated hemoglobin (Hb A1C)     Status: Abnormal   Collection Time: 08/14/20  2:55 PM  Result Value Ref Range   Hemoglobin A1C     HbA1c POC (<> result, manual entry)     HbA1c, POC (prediabetic range)     HbA1c, POC (controlled diabetic range) 9.7 (A) 0.0 - 7.0 %      ASSESSMENT/PLAN:   Type 2 diabetes mellitus with diabetic neuropathy, without long-term current use of insulin (HCC) A1c has not changed in the last 3 months.  A1c from 9.8-9.7.  She reports that she has been compliant with her medications.  She brings her medications with her today and is able to advise when she takes them.  Therefore suspect compliance.  Will increase Farxiga to 10 mg from 5 mg.  Can recheck BMP at next visit.  She has not been able to tolerate more Metformin, therefore we will keep at 1000 mg daily.  Have her come back in 1 month.  We will send in a glucose monitor as well for her to  check her CBGs.  Will increase gabapentin to 300 mg 3 times daily to see if this helps with her neuropathy.     08/16/20, DO [facility]

## 2020-08-14 NOTE — Patient Instructions (Addendum)
Follow-up thank you for coming to see me today. It was a pleasure. Today we talked about:   We will increase your gabapentin to three times daily.  You will start taking a cholesterol medication and we will recheck your cholesterol levels in 3 months.  Please follow-up with me in 1 month.  If you have any questions or concerns, please do not hesitate to call the office at [phone].  Best,   Luis Abed, DO

## 2020-08-14 NOTE — Assessment & Plan Note (Addendum)
A1c has not changed in the last 3 months.  A1c from 9.8-9.7.  She reports that she has been compliant with her medications.  She brings her medications with her today and is able to advise when she takes them.  Therefore suspect compliance.  Will increase Farxiga to 10 mg from 5 mg.  Can recheck BMP at next visit.  She has not been able to tolerate more Metformin, therefore we will keep at 1000 mg daily.  Have her come back in 1 month.  We will send in a glucose monitor as well for her to check her CBGs.  Will increase gabapentin to 300 mg 3 times daily to see if this helps with her neuropathy.

## 2020-08-26 ENCOUNTER — Other Ambulatory Visit: Payer: Self-pay | Admitting: Family Medicine

## 2020-08-26 ENCOUNTER — Telehealth: Payer: Self-pay | Admitting: *Deleted

## 2020-08-26 NOTE — Telephone Encounter (Signed)
Received fax from pharmacy that said pt already has meter, need Rx for strips and lancets. Shelley Bartlett, CMA

## 2020-08-26 NOTE — Progress Notes (Signed)
error 

## 2020-08-26 NOTE — Telephone Encounter (Signed)
Did it say what brand?  I can't find any documentation of what meter she has

## 2020-08-27 ENCOUNTER — Other Ambulatory Visit: Payer: Self-pay | Admitting: Family Medicine

## 2020-08-27 DIAGNOSIS — E1140 Type 2 diabetes mellitus with diabetic neuropathy, unspecified: Secondary | ICD-10-CM

## 2020-08-27 MED ORDER — ACCU-CHEK GUIDE VI STRP: ORAL_STRIP | 12 refills | Status: DC

## 2020-08-27 MED ORDER — ACCU-CHEK SOFTCLIX LANCETS MISC: 12 refills | Status: DC

## 2020-08-27 NOTE — Telephone Encounter (Signed)
Contacted pharmacy and kelly said that it is the accu-chek guide strips, and accu-chek lancets.  She thought it was the fast click but said just put accu-chek lancets and they would get the correct ones. Shelley Bartlett, CMA

## 2020-08-28 ENCOUNTER — Telehealth: Payer: Self-pay | Admitting: *Deleted

## 2020-08-28 MED ORDER — ACCU-CHEK GUIDE VI STRP: ORAL_STRIP | 12 refills | Status: DC

## 2020-08-28 MED ORDER — ACCU-CHEK SOFTCLIX LANCETS MISC: 1.0000 | Freq: Four times a day (QID) | 12 refills | Status: DC

## 2020-08-28 NOTE — Telephone Encounter (Signed)
Received fax requesting specific instructions for the test strips and lancets so they can bill insurance. Sesar Madewell Zimmerman Rumple, CMA

## 2020-08-28 NOTE — Telephone Encounter (Signed)
Rx sent 

## 2020-08-28 NOTE — Addendum Note (Signed)
Addended by: Unknown Jim on: 08/28/2020 05:19 PM   Modules accepted: Orders

## 2020-09-18 NOTE — Progress Notes (Signed)
    SUBJECTIVE:   CHIEF COMPLAINT / HPI:     T2DM Current regimen: Metformin 1000 mg daily, Farxiga 10 mg increased on 08/14/2020 Did not check CBG today but yesterday was 177 She left her machine at home The lowest she has seen is 125, the highest she has seen 247 one week ago Reports compliance with medications She is on a statin, not on ACE or ARB  Neuropathy Gabapentin was increased to 300 mg 3 times daily on 08/14/2020 She felt dizzy with this, went down to twice a day, then wasn't feeling well, so now is only taking once a day She states that she is having trouble walking from the pain  Vaccinations Needs varicella, hep A, and flu today  PERTINENT  PMH / PSH: T2DM, recent refugee, thyroid nodule  OBJECTIVE:   BP 98/60   Pulse 82   Ht 5\' 1"  (1.549 m)   Wt 130 lb 6.4 oz (59.1 kg)   SpO2 96%   BMI 24.64 kg/m    Physical Exam:  General: 54 y.o. female in NAD Lungs: Breathing comfortably on room air Skin: warm and dry Extremities: No edema, no TTP at b/l plantar fascia   ASSESSMENT/PLAN:   Type 2 diabetes mellitus with diabetic neuropathy, without long-term current use of insulin (HCC) CBGs are still not at goal.  She has tolerated the increase of 44.  Maximum dose for her on Metformin is 1000 mg daily as she cannot tolerate higher due to GI side effects.  We will have her continue to check her sugars.  We will check a BMP today.  We will go ahead and start her on a GLP-1 as well.  She was instructed on how to use Trulicity by the pharmacy resident.  Follow-up with her in 1 month.  We will get another BMP at that time.  Continue statin.  Would not start an ACE or ARB at this time due to low BP.  For her neuropathy, will discontinue gabapentin as she was not able to tolerate higher doses and lower doses have not provided benefit.  We will start her on Cymbalta instead.  She will start at 30 mg daily, maximum dose for this would be 60 mg, can increase at next visit  if she still needs more improvement and is tolerating well.  Follow-up in 1 month.     [place], DO [facility]

## 2020-09-19 ENCOUNTER — Ambulatory Visit: Payer: Medicaid Other | Admitting: Family Medicine

## 2020-09-19 ENCOUNTER — Other Ambulatory Visit: Payer: Self-pay

## 2020-09-19 ENCOUNTER — Encounter: Payer: Self-pay | Admitting: Family Medicine

## 2020-09-19 VITALS — BP 98/60 | HR 82 | Ht 61.0 in | Wt 130.4 lb

## 2020-09-19 DIAGNOSIS — E1140 Type 2 diabetes mellitus with diabetic neuropathy, unspecified: Secondary | ICD-10-CM

## 2020-09-19 DIAGNOSIS — Z23 Encounter for immunization: Secondary | ICD-10-CM | POA: Diagnosis not present

## 2020-09-19 MED ORDER — DULOXETINE HCL 30 MG PO CPEP: 30.0000 mg | ORAL_CAPSULE | Freq: Every day | ORAL | 3 refills | Status: DC

## 2020-09-19 MED ORDER — TRULICITY 0.75 MG/0.5ML ~~LOC~~ SOAJ: 0.7500 mg | SUBCUTANEOUS | 1 refills | Status: DC

## 2020-09-19 NOTE — Patient Instructions (Signed)
Thank you for coming to see me today. It was a pleasure. Today we talked about:   We will talk about your blood work at your next visit.  Stop gabapentin.  Start cymbalta.  We will add trulicity which is an injection once a week.  Please follow-up with me in 1 month.  If you have any questions or concerns, please do not hesitate to call the office at [phone].  Best,   Luis Abed, DO

## 2020-09-19 NOTE — Assessment & Plan Note (Addendum)
CBGs are still not at goal.  She has tolerated the increase of [place].  Maximum dose for her on Metformin is 1000 mg daily as she cannot tolerate higher due to GI side effects.  We will have her continue to check her sugars.  We will check a BMP today.  We will go ahead and start her on a GLP-1 as well.  She was instructed on how to use Trulicity by the pharmacy resident.  Follow-up with her in 1 month.  We will get another BMP at that time.  Continue statin.  Would not start an ACE or ARB at this time due to low BP.  For her neuropathy, will discontinue gabapentin as she was not able to tolerate higher doses and lower doses have not provided benefit.  We will start her on Cymbalta instead.  She will start at 30 mg daily, maximum dose for this would be 60 mg, can increase at next visit if she still needs more improvement and is tolerating well.  Follow-up in 1 month.

## 2020-09-19 NOTE — Progress Notes (Signed)
Counseled on administration of new medication Trulicty at request of provider.  Patient educated on purpose and proper use of Trulicity with the assistance of interpreter.  Instructed patient on proper injection technique and once-weekly dosing.  Following instruction patient verbalized understanding of treatment plan.  Teach-back method was utilized and patient was able to demonstrate technique.    Rexford Maus, PharmD PGY-1 Acute Care Pharmacy Resident 09/19/2020 3:27 PM

## 2020-09-20 ENCOUNTER — Telehealth: Payer: Self-pay | Admitting: Family Medicine

## 2020-09-20 DIAGNOSIS — E1140 Type 2 diabetes mellitus with diabetic neuropathy, unspecified: Secondary | ICD-10-CM

## 2020-09-20 LAB — BASIC METABOLIC PANEL
BUN/Creatinine Ratio: 13 (ref 9–23)
BUN: 10 mg/dL (ref 6–24)
CO2: 17 mmol/L — ABNORMAL LOW (ref 20–29)
Calcium: 11.7 mg/dL — ABNORMAL HIGH (ref 8.7–10.2)
Chloride: 113 mmol/L — ABNORMAL HIGH (ref 96–106)
Creatinine, Ser: 0.76 mg/dL (ref 0.57–1.00)
GFR calc Af Amer: 104 mL/min/{1.73_m2} (ref >59)
GFR calc non Af Amer: 90 mL/min/{1.73_m2} (ref >59)
Glucose: 171 mg/dL — ABNORMAL HIGH (ref 65–99)
Potassium: 4.5 mmol/L (ref 3.5–5.2)
Sodium: 154 mmol/L — ABNORMAL HIGH (ref 134–144)

## 2020-09-20 NOTE — Telephone Encounter (Signed)
Attempted to call patient for concern for possible euglycemic DKA on labwork yesterday.  Patient was well appearing at office visit yesterday.  Attempted to called with interpretor x2, but no answer and VM not set up.    Plan is to have her stop farxiga and come back in two weeks for a lab visit, then will decide if we are able to restart.  [facility] team- I will continue to try to reach her, but can you try to call her as well?  If you speak with her or her husband, she needs to stop taking farxiga and come back in 2 weeks for a lab only visit.  Encourage hydration with water as well.

## 2020-09-23 ENCOUNTER — Telehealth: Payer: Self-pay | Admitting: Family Medicine

## 2020-09-23 NOTE — Telephone Encounter (Signed)
Attempted to try to call patient again with interpreter.  No answer.  Unable to leave voicemail.  Emergency contact listed on her chart also has the same number.  Have been trying to get in touch with patient in regards to her recent blood work.  She needs to stop [place] and come in for a lab visit in 2 weeks.  She should also continue to hydrate well with water orally.

## 2020-09-23 NOTE — Telephone Encounter (Signed)
Called daughter's number listed in husband's chart - [phone].  Advised her to have her mother stop taking Marcelline Deist and to come in tomorrow or Wednesday for just a lab visit for a BMP.  She voiced understanding and will also have her mother drink a lot of water.

## 2020-09-24 NOTE — Telephone Encounter (Signed)
This has been handled in another phone encounter.Shelley Bartlett, CMA  

## 2020-09-25 ENCOUNTER — Other Ambulatory Visit: Payer: Self-pay | Admitting: Obstetrics and Gynecology

## 2020-09-25 ENCOUNTER — Other Ambulatory Visit: Payer: Medicaid Other

## 2020-09-25 ENCOUNTER — Ambulatory Visit
Admission: RE | Admit: 2020-09-25 | Discharge: 2020-09-25 | Disposition: A | Discharge status: Home / Self Care | Payer: No Typology Code available for payment source | Source: Ambulatory Visit | Attending: Obstetrics and Gynecology | Admitting: Obstetrics and Gynecology

## 2020-09-25 ENCOUNTER — Other Ambulatory Visit: Payer: Self-pay

## 2020-09-25 DIAGNOSIS — Z111 Encounter for screening for respiratory tuberculosis: Secondary | ICD-10-CM

## 2020-09-25 DIAGNOSIS — E1140 Type 2 diabetes mellitus with diabetic neuropathy, unspecified: Secondary | ICD-10-CM

## 2020-09-30 ENCOUNTER — Other Ambulatory Visit: Payer: Self-pay | Admitting: Family Medicine

## 2020-09-30 DIAGNOSIS — E1140 Type 2 diabetes mellitus with diabetic neuropathy, unspecified: Secondary | ICD-10-CM

## 2020-10-04 ENCOUNTER — Emergency Department (HOSPITAL_COMMUNITY): Payer: Medicaid Other

## 2020-10-04 ENCOUNTER — Other Ambulatory Visit: Payer: Self-pay

## 2020-10-04 ENCOUNTER — Encounter (HOSPITAL_COMMUNITY): Payer: Self-pay

## 2020-10-04 ENCOUNTER — Emergency Department (HOSPITAL_COMMUNITY)
Admission: EM | Admit: 2020-10-04 | Discharge: 2020-10-05 | Disposition: A | Discharge status: Home / Self Care | Payer: Medicaid Other | Attending: Emergency Medicine | Admitting: Emergency Medicine

## 2020-10-04 DIAGNOSIS — E1140 Type 2 diabetes mellitus with diabetic neuropathy, unspecified: Secondary | ICD-10-CM | POA: Diagnosis not present

## 2020-10-04 DIAGNOSIS — Z7984 Long term (current) use of oral hypoglycemic drugs: Secondary | ICD-10-CM | POA: Diagnosis not present

## 2020-10-04 DIAGNOSIS — N3000 Acute cystitis without hematuria: Secondary | ICD-10-CM | POA: Diagnosis not present

## 2020-10-04 DIAGNOSIS — R42 Dizziness and giddiness: Secondary | ICD-10-CM | POA: Diagnosis present

## 2020-10-04 LAB — URINALYSIS, ROUTINE W REFLEX MICROSCOPIC
Bilirubin Urine: NEGATIVE
Glucose, UA: NEGATIVE mg/dL
Hgb urine dipstick: NEGATIVE
Ketones, ur: NEGATIVE mg/dL
Nitrite: NEGATIVE
Protein, ur: 30 mg/dL — AB
Specific Gravity, Urine: 1.028 (ref 1.005–1.030)
pH: 5 (ref 5.0–8.0)

## 2020-10-04 LAB — CBC
HCT: 43.2 % (ref 36.0–46.0)
Hemoglobin: 14.2 g/dL (ref 12.0–15.0)
MCH: 27.3 pg (ref 26.0–34.0)
MCHC: 32.9 g/dL (ref 30.0–36.0)
MCV: 83.1 fL (ref 80.0–100.0)
Platelets: 154 10*3/uL (ref 150–400)
RBC: 5.2 MIL/uL — ABNORMAL HIGH (ref 3.87–5.11)
RDW: 13.3 % (ref 11.5–15.5)
WBC: 4.4 10*3/uL (ref 4.0–10.5)
nRBC: 0 % (ref 0.0–0.2)

## 2020-10-04 LAB — BASIC METABOLIC PANEL
Anion gap: 12 (ref 5–15)
BUN: 10 mg/dL (ref 6–20)
CO2: 22 mmol/L (ref 22–32)
Calcium: 9.2 mg/dL (ref 8.9–10.3)
Chloride: 104 mmol/L (ref 98–111)
Creatinine, Ser: 0.64 mg/dL (ref 0.44–1.00)
GFR, Estimated: >60 mL/min (ref >60)
Glucose, Bld: 159 mg/dL — ABNORMAL HIGH (ref 70–99)
Potassium: 3.8 mmol/L (ref 3.5–5.1)
Sodium: 138 mmol/L (ref 135–145)

## 2020-10-04 LAB — CBG MONITORING, ED: Glucose-Capillary: 199 mg/dL — ABNORMAL HIGH (ref 70–99)

## 2020-10-04 MED ORDER — SODIUM CHLORIDE 0.9 % IV BOLUS
1000.0000 mL | Freq: Once | INTRAVENOUS | Status: AC
  Administered 2020-10-04: 1000 mL via INTRAVENOUS

## 2020-10-04 MED ORDER — MECLIZINE HCL 25 MG PO TABS
25.0000 mg | ORAL_TABLET | Freq: Once | ORAL | Status: AC
  Administered 2020-10-04: 25 mg via ORAL
  Filled 2020-10-04: qty 1

## 2020-10-04 NOTE — ED Triage Notes (Signed)
Pt accompanied by daughter who reports pt has had 6 days of intermittent dizziness and n/v. Pt denies any abd pain, hx of diabetes. Pt alert, ambulatory.

## 2020-10-04 NOTE — ED Provider Notes (Signed)
[facility] EMERGENCY DEPARTMENT Provider Note   CSN: [id number] Arrival date & time: 10/04/20  1301     History Chief Complaint  Patient presents with  . Dizziness  . Emesis    Shelley Bartlett is a 54 y.o. female.  The history is provided by the patient, medical records and a relative.  Dizziness Quality:  Head spinning and room spinning Severity:  Severe Onset quality:  Sudden Duration:  6 days Timing:  Intermittent Progression:  Waxing and waning Chronicity:  New Relieved by:  Nothing Worsened by:  Lying down Ineffective treatments:  None tried Associated symptoms: nausea and vomiting   Associated symptoms: no chest pain, no diarrhea, no headaches, no palpitations, no shortness of breath, no syncope and no weakness   Risk factors: no hx of stroke and no hx of vertigo        Past Medical History:  Diagnosis Date  . Diabetes (HCC)   . Goiter     Patient Active Problem List   Diagnosis Date Noted  . Acute bilateral low back pain without sciatica 07/12/2020  . Thyroid nodule 06/03/2020  . Unknown varicella vaccination status 05/14/2020  . Type 2 diabetes mellitus with diabetic neuropathy, without long-term current use of insulin (HCC) 05/07/2020  . Goiter 05/07/2020  . Screen for colon cancer 05/07/2020  . Refugee health examination 05/05/2020    History reviewed. No pertinent surgical history.   OB History    Gravida  5   Para  5   Term  5   Preterm      AB      Living  5     SAB      TAB      Ectopic      Multiple      Live Births           Obstetric Comments  Youngest child born 2001        No family history on file.  Social History   Tobacco Use  . Smoking status: Never Smoker  . Smokeless tobacco: Never Used  Substance Use Topics  . Alcohol use: Never  . Drug use: Never    Home Medications Prior to Admission medications   Medication Sig Start Date End Date Taking? Authorizing Provider    Accu-Chek Softclix Lancets lancets 1 each by Other route in the morning, at noon, in the evening, and at bedtime. Use as instructed 08/28/20   Meccariello, Solmon Ice, DO  atorvastatin (LIPITOR) 20 MG tablet Take 1 tablet (20 mg total) by mouth daily. 08/14/20   Meccariello, Solmon Ice, DO  dapagliflozin propanediol (FARXIGA) 10 MG TABS tablet Take 1 tablet (10 mg total) by mouth daily. 08/14/20   Meccariello, Solmon Ice, DO  Dulaglutide (TRULICITY) 0.75 MG/0.5ML SOPN Inject 0.75 mg into the skin once a week. 09/19/20   Meccariello, Solmon Ice, DO  DULoxetine (CYMBALTA) 30 MG capsule Take 1 capsule (30 mg total) by mouth daily. 09/19/20   Meccariello, Solmon Ice, DO  glucose blood (ACCU-CHEK GUIDE) test strip Use four times daily to check blood sugars. 08/28/20   Meccariello, Solmon Ice, DO  lidocaine (LIDODERM) 5 % Place 1 patch onto the skin daily. Remove & Discard patch within 12 hours or as directed by MD 07/11/20   Meccariello, Solmon Ice, DO  metFORMIN (GLUCOPHAGE-XR) 500 MG 24 hr tablet Take 2 tablets (1,000 mg total) by mouth daily with breakfast. 05/14/20   Meccariello, Solmon Ice, DO    Allergies  Patient has no known allergies.  Review of Systems   Review of Systems  Constitutional: Negative for chills, diaphoresis, fatigue and fever.  HENT: Negative for congestion.   Eyes: Negative for visual disturbance.  Respiratory: Negative for cough, chest tightness and shortness of breath.   Cardiovascular: Negative for chest pain, palpitations and syncope.  Gastrointestinal: Positive for nausea and vomiting. Negative for abdominal pain, constipation and diarrhea.  Genitourinary: Positive for decreased urine volume. Negative for dysuria and flank pain.  Musculoskeletal: Negative for back pain, neck pain and neck stiffness.  Skin: Negative for rash and wound.  Neurological: Positive for dizziness. Negative for tremors, seizures, syncope, facial asymmetry, weakness, light-headedness, numbness and headaches.   Psychiatric/Behavioral: Negative for agitation and confusion.  All other systems reviewed and are negative.   Physical Exam Updated Vital Signs BP 124/86 (BP Location: Left Arm)   Pulse 84   Temp 98.3 F (36.8 C) (Oral)   Resp 17   Ht  (1.549 m)   Wt 59 kg   SpO2 100%   BMI 24.58 kg/m   Physical Exam Vitals and nursing note reviewed.  Constitutional:      General: She is not in acute distress.    Appearance: She is well-developed. She is not ill-appearing, toxic-appearing or diaphoretic.  HENT:     Head: Normocephalic and atraumatic.     Right Ear: External ear normal.     Left Ear: External ear normal.     Nose: Nose normal. No congestion or rhinorrhea.     Mouth/Throat:     Mouth: Mucous membranes are moist.     Pharynx: No oropharyngeal exudate or posterior oropharyngeal erythema.  Eyes:     Extraocular Movements: Extraocular movements intact.     Conjunctiva/sclera: Conjunctivae normal.     Pupils: Pupils are equal, round, and reactive to light.  Cardiovascular:     Rate and Rhythm: Normal rate.     Pulses: Normal pulses.     Heart sounds: No murmur heard.   Pulmonary:     Effort: Pulmonary effort is normal. No respiratory distress.     Breath sounds: No stridor. No wheezing or rhonchi.  Chest:     Chest wall: No tenderness.  Abdominal:     General: Abdomen is flat. There is no distension.     Tenderness: There is no abdominal tenderness. There is no right CVA tenderness, left CVA tenderness, guarding or rebound.  Musculoskeletal:        General: No tenderness.     Cervical back: Normal range of motion and neck supple. No tenderness.     Right lower leg: No edema.     Left lower leg: No edema.  Skin:    General: Skin is warm.     Capillary Refill: Capillary refill takes less than 2 seconds.     Coloration: Skin is not pale.     Findings: No erythema or rash.  Neurological:     General: No focal deficit present.     Mental Status: She is alert and  oriented to person, place, and time.     Cranial Nerves: No cranial nerve deficit.     Sensory: No sensory deficit.     Motor: No weakness or abnormal muscle tone.     Coordination: Coordination normal.     Deep Tendon Reflexes: Reflexes are normal and symmetric.     Comments: Gait initially deferred with the dizziness  Psychiatric:        Mood and  Affect: Mood normal.     ED Results / Procedures / Treatments   Labs (all labs ordered are listed, but only abnormal results are displayed) Labs Reviewed  BASIC METABOLIC PANEL - Abnormal; Notable for the following components:      Result Value   Glucose, Bld 159 (*)    All other components within normal limits  CBC - Abnormal; Notable for the following components:   RBC 5.20 (*)    All other components within normal limits  URINALYSIS, ROUTINE W REFLEX MICROSCOPIC - Abnormal; Notable for the following components:   APPearance HAZY (*)    Protein, ur 30 (*)    Leukocytes,Ua SMALL (*)    Bacteria, UA RARE (*)    All other components within normal limits  CBG MONITORING, ED - Abnormal; Notable for the following components:   Glucose-Capillary 199 (*)    All other components within normal limits    EKG EKG Interpretation  Date/Time:  Friday October 04 2020 13:15:36 EST Ventricular Rate:  86 PR Interval:  190 QRS Duration: 86 QT Interval:  398 QTC Calculation: 476 R Axis:     Text Interpretation: Normal sinus rhythm Normal ECG No prior ECG for comparison. No STEMI Confirmed by Theda Belfast ([id number]) on 10/04/2020 8:36:15 PM   Radiology No results found.  Procedures Procedures (including critical care time)  Medications Ordered in ED Medications  sodium chloride 0.9 % bolus 1,000 mL (1,000 mLs Intravenous New Bag/Given 10/04/20 2215)  meclizine (ANTIVERT) tablet 25 mg (25 mg Oral Given 10/04/20 2215)    ED Course  I have reviewed the triage vital signs and the nursing notes.  Pertinent labs & imaging results that were  available during my care of the patient were reviewed by me and considered in my medical decision making (see chart for details).    MDM Rules/Calculators/A&P                          Sarajane Fambrough Hersman is a 54 y.o. female with a past medical history significant for diabetes who presents for 6 days of intermittent dizziness, nausea, vomiting.  Patient reports that 6 days ago, she had onset of room spinning dizziness which has never had the past.  She denies a history of vertigo.  She denies recent medication changes.  She says that she feels like the world is spinning and she gets unsteady.  She has not had any falls but felt she almost did.  She denied feeling lightheaded and felt more dizzy.  She reports she has had nausea and vomiting on and off for the last 6 days.  She is not eating or drinking much and feels slightly dehydrated.  She denies any chest pain, palpitations, shortness of breath.  Denies fevers or chills.  Denies neck pain or neck stiffness.  Denies any constipation, diarrhea, or extremity pains.  She denies numbness, tingling, or weakness of extremities.  She does report that her urine has been darker and more concentrated feeling.  She would not describe dysuria to me.  On exam, patient had normal sensation and strength in extremities.  Normal finger-nose-finger testing.  Symmetric smile.  Clear speech reported per family as patient does not speak [place].  Normal extraocular movements.  Pupils symmetric and reactive.  Abdomen nontender, chest nontender.  Lungs clear.  Clinically I do suspect that patient is likely having vertigo as she at one point said that it was worse when she laid back  however, her new room spinning dizziness and nausea and vomiting and no history of vertigo, I do feel she needs MRI to rule out acute stroke.  We will give her fluids and meclizine.  Her work-up in triage did show evidence of a possible UTI given the change in her urine.  If work-up is reassuring  and there is no evidence of stroke, anticipate discharge with orders for meclizine and antibiotics for UTI.  Patient and family are in agreement with plan, awaiting results of reassessment and MRI.  Anticipate discharge if MRI is reassuring with prescription for meclizine and antibiotics for her UTI.  Care transferred to oncoming team while waiting for results of MRI and reassessment  Final Clinical Impression(s) / ED Diagnoses Final diagnoses:  Dizziness  Acute cystitis without hematuria     Clinical Impression: 1. Dizziness   2. Acute cystitis without hematuria     Disposition: Awaiting reassessment after fluids, meclizine, and MRI.  If is reassuring, anticipate discharge home  This note was prepared with assistance of Dragon voice recognition software. Occasional wrong-word or sound-a-like substitutions may have occurred due to the inherent limitations of voice recognition software.     Mardene Lessig, Canary Brim, MD 10/05/20 [phone]

## 2020-10-05 MED ORDER — MECLIZINE HCL 25 MG PO TABS: 25.0000 mg | ORAL_TABLET | Freq: Three times a day (TID) | ORAL | 0 refills | Status: DC | PRN

## 2020-10-05 MED ORDER — CEPHALEXIN 500 MG PO CAPS: 500.0000 mg | ORAL_CAPSULE | Freq: Two times a day (BID) | ORAL | 0 refills | Status: AC

## 2020-10-05 NOTE — Discharge Instructions (Addendum)
Your work-up today did show evidence of urinary tract infection given the urine changes you described.  Please take the antibiotics to treat this.  Please use the meclizine to help with the dizziness and unsteadiness.  Please maintain your hydration.  Please follow-up with your primary doctor.

## 2020-10-05 NOTE — ED Notes (Addendum)
Discharge instructions provided to patient and family. Verbalized understanding. Alert and oriented. IV lock removed. Escorted out of ED via w/c with family. 

## 2020-10-13 NOTE — Progress Notes (Deleted)
    SUBJECTIVE:   CHIEF COMPLAINT / HPI:   T2DM with neuropathy Current regimen: Metformin 1000 mg daily, Farxiga 10 mg***, Trulicity 0.75 mg once weekly CBG*** Had had concerned due to lab abnormalities at last visit, patient had repeat BMP in ED on 12/30, however with stable creatinine improvement in electrolyte disturbances Given no improvement with gabapentin, last visit patient was changed to Cymbalta for her neuropathy ***  Dizziness Patient seen in ED on 12/3 for dizziness She had an MRI that was negative for stroke, did show few scattered foci of hyperintense T2 signal in frontal white matter which could be seen in setting of migraine headaches She was discharged on Keflex for UTI and meclizine ***  PERTINENT  PMH / PSH: ***  OBJECTIVE:   There were no vitals taken for this visit.  ***  ASSESSMENT/PLAN:   No problem-specific Assessment & Plan notes found for this encounter.     Unknown Jim, DO [facility]

## 2020-10-14 ENCOUNTER — Ambulatory Visit: Payer: Medicaid Other | Admitting: Family Medicine

## 2020-10-14 ENCOUNTER — Ambulatory Visit: Payer: Medicaid Other | Admitting: Student in an Organized Health Care Education/Training Program

## 2020-10-14 ENCOUNTER — Other Ambulatory Visit: Payer: Self-pay

## 2020-10-14 VITALS — BP 92/60 | HR 94 | Ht 61.0 in | Wt 130.2 lb

## 2020-10-14 DIAGNOSIS — K089 Disorder of teeth and supporting structures, unspecified: Secondary | ICD-10-CM

## 2020-10-14 DIAGNOSIS — Z23 Encounter for immunization: Secondary | ICD-10-CM | POA: Diagnosis not present

## 2020-10-14 DIAGNOSIS — E119 Type 2 diabetes mellitus without complications: Secondary | ICD-10-CM

## 2020-10-14 DIAGNOSIS — E1140 Type 2 diabetes mellitus with diabetic neuropathy, unspecified: Secondary | ICD-10-CM

## 2020-10-14 DIAGNOSIS — G5690 Unspecified mononeuropathy of unspecified upper limb: Secondary | ICD-10-CM | POA: Diagnosis not present

## 2020-10-14 MED ORDER — METFORMIN HCL ER 500 MG PO TB24: 500.0000 mg | ORAL_TABLET | Freq: Every day | ORAL | 2 refills | Status: DC

## 2020-10-14 MED ORDER — DULOXETINE HCL 30 MG PO CPEP: 60.0000 mg | ORAL_CAPSULE | Freq: Every day | ORAL | 3 refills | Status: DC

## 2020-10-14 NOTE — Progress Notes (Signed)
    SUBJECTIVE:   CHIEF COMPLAINT / HPI:   Diabetes Per her chart review, her current diabetic regimen should include: -Metformin 500 mg twice daily -Farxiga 10 mg daily -Trulicity 0.5 mg weekly It was very difficult to determine what medicine she is in fact taking at home.  She stated very clearly that she does take weekly injections.  At times, she said that she takes no pills and at other times she reported taking some pills at home.  She does not know what pills she takes or how many.  Upper extremity neuropathy She reports that she has discomfort and numbness in fingers 2, 3, 4 and 5 on both of her hands.  She has previously been prescribed gabapentin for this issue.  She is previously taking gabapentin at night.  It is unclear if she continues to take gabapentin at night.  During her interview, she made multiple mentions of a pill that she takes at night that makes her feel groggy and dizzy.  I believe this may be the gabapentin.  It is unclear if she is currently taking this or how much.  She was also started on Cymbalta 30 mg at her last visit.  Tooth pain She reports a painful tooth in her left maxilla.  PERTINENT  PMH / PSH: Type 2 diabetes  OBJECTIVE:   BP 92/60   Pulse 94   Ht 5\' 1"  (1.549 m)   Wt 130 lb 4 oz (59.1 kg)   SpO2 99%   BMI 24.61 kg/m    General: Alert and cooperative and appears to be in no acute distress HEENT: Poor dentition with a mostly eroded left molar.  Tender to palpation. Cardio: Normal S1 and S2, no S3 or S4. Rhythm is regular. No murmurs or rubs.   Pulm: Clear to auscultation bilaterally, no crackles, wheezing, or diminished breath sounds. Normal respiratory effort Abdomen: Bowel sounds normal. Abdomen soft and non-tender.  Extremities: No peripheral edema. Warm/ well perfused.  Strong radial pulse. Neuro: Cranial nerves grossly intact  ASSESSMENT/PLAN:   Type 2 diabetes mellitus with diabetic neuropathy, without long-term current use of  insulin (HCC) Too early for A1c check today.  Is very difficult to assess the current state of her diabetes.  She is not familiar with her medicine.  Today, we discussed the importance of taking medicine regularly.  She was given instructions in and [place] of the medicine that she is currently prescribed.  She was encouraged to return to clinic in 1 month to discuss these medicines with her PCP.  She was encouraged to bring all of her medication with her to that appointment. -Follow-up BMP  Upper extremity neuropathy Not consistent with carpal tunnel syndrome.  Gabapentin can certainly be helpful in this situation although it seems that she is not able to tolerate any higher doses of gabapentin.  Per the instructions from her last visit, I will move forward with increasing her Cymbalta today.  We also spent time discussing this problem and she was informed that this is not likely reversible.  She was told that she will likely have this problem for the rest of her life and the medicine can help control any discomfort that she has. -Increase Cymbalta to 60 mg daily  Poor dentition She will need to follow-up with a dentist for proper assessment and service of her teeth.     [place], MD [facility]

## 2020-10-14 NOTE — Progress Notes (Deleted)
    SUBJECTIVE:   CHIEF COMPLAINT / HPI:   Diabetes Metformin    PERTINENT  PMH / PSH: ***  OBJECTIVE:   BP 92/60   Pulse 94   Ht 5\' 1"  (1.549 m)   Wt 130 lb 4 oz (59.1 kg)   SpO2 99%   BMI 24.61 kg/m   ***  ASSESSMENT/PLAN:   No problem-specific Assessment & Plan notes found for this encounter.     , MD [facility] [place]

## 2020-10-14 NOTE — Assessment & Plan Note (Addendum)
Too early for A1c check today.  Is very difficult to assess the current state of her diabetes.  She is not familiar with her medicine.  Today, we discussed the importance of taking medicine regularly.  She was given instructions in [place] and [place] of the medicine that she is currently prescribed.  She was encouraged to return to clinic in 1 month to discuss these medicines with her PCP.  She was encouraged to bring all of her medication with her to that appointment. -Follow-up BMP

## 2020-10-14 NOTE — Assessment & Plan Note (Signed)
She will need to follow-up with a dentist for proper assessment and service of her teeth.

## 2020-10-14 NOTE — Assessment & Plan Note (Signed)
Not consistent with carpal tunnel syndrome.  Gabapentin can certainly be helpful in this situation although it seems that she is not able to tolerate any higher doses of gabapentin.  Per the instructions from her last visit, I will move forward with increasing her Cymbalta today.  We also spent time discussing this problem and she was informed that this is not likely reversible.  She was told that she will likely have this problem for the rest of her life and the medicine can help control any discomfort that she has. -Increase Cymbalta to 60 mg daily

## 2020-10-14 NOTE — Patient Instructions (Addendum)
Diabetes: Today, usually you are only taking the injection for diabetes medicine.  Here is all the medicine that I want you to take: -Trulicity, this is the injection that you should take once every week. -Farxiga, this is a pill that you should take once every day -Metformin, this is a pill that you should take one in the morning and one in the evening  Hand neuropathy: I want you to increase your medicine called Cymbalta to 2 pills every day.  That should be a total of 60 mg of Cymbalta every day.  Please come back to clinic in 1 month to see how these problems are doing.  Diabte : aujourd'hui, vous ne prenez gnralement que l'injection pour [employer] diabte. Voici tous les mdicaments que je veux que vous preniez : -Trulicity, c'est l'injection que vous devez faire une fois par semaine. -Farxiga, c'est une pilule que tu dois prendre une fois par jour -Metformine, c'est une pilule que vous devez prendre [employer] le matin et une le soir  Neuropathie de la main : je veux que vous augmentiez votre mdicament appel Cymbalta  2 comprims par [employer]. Cela devrait tre un total de 60 mg de Cymbalta par jour.  Veuillez revenir  la [employer] 1 mois pour voir comment ces problmes vont.

## 2020-10-15 LAB — BASIC METABOLIC PANEL
BUN/Creatinine Ratio: 10 (ref 9–23)
BUN: 6 mg/dL (ref 6–24)
CO2: 19 mmol/L — ABNORMAL LOW (ref 20–29)
Calcium: 9.3 mg/dL (ref 8.7–10.2)
Chloride: 108 mmol/L — ABNORMAL HIGH (ref 96–106)
Creatinine, Ser: 0.63 mg/dL (ref 0.57–1.00)
GFR calc Af Amer: 118 mL/min/{1.73_m2} (ref >59)
GFR calc non Af Amer: 103 mL/min/{1.73_m2} (ref >59)
Glucose: 148 mg/dL — ABNORMAL HIGH (ref 65–99)
Potassium: 4.3 mmol/L (ref 3.5–5.2)
Sodium: 144 mmol/L (ref 134–144)

## 2020-10-17 ENCOUNTER — Telehealth: Payer: Self-pay

## 2020-10-17 ENCOUNTER — Other Ambulatory Visit: Payer: Self-pay | Admitting: Family Medicine

## 2020-10-17 DIAGNOSIS — E1140 Type 2 diabetes mellitus with diabetic neuropathy, unspecified: Secondary | ICD-10-CM

## 2020-10-17 DIAGNOSIS — E119 Type 2 diabetes mellitus without complications: Secondary | ICD-10-CM

## 2020-10-17 MED ORDER — DULOXETINE HCL 30 MG PO CPEP: 60.0000 mg | ORAL_CAPSULE | Freq: Every day | ORAL | 3 refills | Status: DC

## 2020-10-17 MED ORDER — METFORMIN HCL ER 500 MG PO TB24: 500.0000 mg | ORAL_TABLET | Freq: Every day | ORAL | 2 refills | Status: DC

## 2020-10-17 NOTE — Telephone Encounter (Signed)
I sent the refills.  Hopefully that fixes the situation, but if not, we can send to Dr. Leveda Anna and he can help sign!  Thanks!

## 2020-10-17 NOTE — Telephone Encounter (Signed)
Received phone call from pharmacy regarding prescriptions sent over on 12/13. Pharmacist reports that Dr. Homero Fellers is not listed as Medicaid provider and that supervising physician will need to send in medications.   Medications are metformin and duloxetine.   Will forward to Dr. Tommie Sams, RN

## 2020-11-08 ENCOUNTER — Encounter: Payer: Self-pay | Admitting: Family Medicine

## 2020-11-08 NOTE — Progress Notes (Signed)
Entered in error

## 2020-11-20 ENCOUNTER — Ambulatory Visit: Payer: Medicaid Other | Admitting: Family Medicine

## 2020-11-20 ENCOUNTER — Other Ambulatory Visit: Payer: Self-pay

## 2020-11-20 VITALS — BP 110/72 | HR 67 | Wt 133.0 lb

## 2020-11-20 DIAGNOSIS — E1140 Type 2 diabetes mellitus with diabetic neuropathy, unspecified: Secondary | ICD-10-CM

## 2020-11-20 DIAGNOSIS — M25531 Pain in right wrist: Secondary | ICD-10-CM | POA: Diagnosis not present

## 2020-11-20 LAB — POCT GLYCOSYLATED HEMOGLOBIN (HGB A1C): Hemoglobin A1C: 7.6 % — AB (ref 4.0–5.6)

## 2020-11-20 MED ORDER — TRULICITY 0.75 MG/0.5ML ~~LOC~~ SOAJ: 0.7500 mg | SUBCUTANEOUS | 1 refills | Status: DC

## 2020-11-20 NOTE — Progress Notes (Signed)
    SUBJECTIVE:   CHIEF COMPLAINT / HPI:   T2DM with neuropathy  Current regimen: Farxiga 10mg  daily, trulicity 0.75mg  weekly, metformin 500mg  daily She ran out of trulicity last week For a month, she has not taken any PO meds including lipitor and cymbalta because she would feel dizzy She was taking a medication twice a day, not sure if it was metformin or duloxetine CBGs: 150 today, 162 yesterday  Right wrist pain  Reports that she had had this a few weeks to months Worse in her thumb Also moves from her wrist to her palm Husband states that it is similar to his pain (he has carpal tunnel syndrome) She would like braces for this  iPad interpretor [place] used for entirety of encounter,  PERTINENT  PMH / PSH: T2DM, history of thyroid nodule  OBJECTIVE:   BP 110/72   Pulse 67   Wt 133 lb (60.3 kg)   SpO2 98%   BMI 25.13 kg/m    Physical Exam:  General: 55 y.o. female in NAD Lungs: Breathing comfortably on room air Abdomen: Soft, non-tender to palpation, non-distended, positive bowel sounds Skin: warm and dry   Limited right hand: Inspection: Slight swelling over first MCP on right hand, no overlying erythema, no other deformities noted.[id number] No swelling, erythema or bruising Palpation: Mild tenderness to palpation of first MCP and grinding of first MCP ROM: Full ROM of the digits and wrist b/l. Fully able to extend and flex fingers. Strength: 4/5 grip strength on right, 5/5 grip strength on left. Neurovascular: NV intact b/l Special tests: Positive Tinel's at carpal tunnel and right   Results for orders placed or performed in visit on 11/20/20 (from the past 24 hour(s))  HgB A1c     Status: Abnormal   Collection Time: 11/20/20 10:30 AM  Result Value Ref Range   Hemoglobin A1C 7.6 (A) 4.0 - 5.6 %   HbA1c POC (<> result, manual entry)     HbA1c, POC (prediabetic range)     HbA1c, POC (controlled diabetic range)     ASSESSMENT/PLAN:   Type 2 diabetes  mellitus with diabetic neuropathy, without long-term current use of insulin (HCC) A1c is improving.  CBGs from yesterday and today could still be improved.  She has at least been taking Trulicity weekly, but has not this week.  Refill provided for this.  Encouraged her to get back on her daily oral medications.  Went over with her husband and interpreter carefully to ensure that they know what medications to give.  Advised to take metformin 500 mg daily, not twice daily as she has not been able to tolerate this in the past and may be why she was not feeling well and stopped her medications.  Also advised to use duloxetine only once daily and that this could help improve her neuropathy pain, but it would never go completely away.  She voiced understanding.  Advised her to bring all of her medications with her when she returns for her next visit.  Right wrist pain Findings are consistent with carpal tunnel.  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  Advised Tylenol for pain and also given her a right wrist splint to use at night from sports medicine.  She was given this prior to leaving today.  Follow-up in 1 month.     11/22/20, DO [facility]

## 2020-11-20 NOTE — Patient Instructions (Signed)
Thank you for coming to see me today. It was a pleasure. Today we talked about:   Start taking your medications again.  They should only be taken once a day.  Your injection should be once a week.  Please follow-up with me in 1 month.  If you have any questions or concerns, please do not hesitate to call the office at [phone].  Best,   Luis Abed, DO

## 2020-11-20 NOTE — Assessment & Plan Note (Signed)
Findings are consistent with carpal tunnel.  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  Advised Tylenol for pain and also given her a right wrist splint to use at night from sports medicine.  She was given this prior to leaving today.  Follow-up in 1 month.

## 2020-11-20 NOTE — Assessment & Plan Note (Signed)
A1c is improving.  CBGs from yesterday and today could still be improved.  She has at least been taking Trulicity weekly, but has not this week.  Refill provided for this.  Encouraged her to get back on her daily oral medications.  Went over with her husband and interpreter carefully to ensure that they know what medications to give.  Advised to take metformin 500 mg daily, not twice daily as she has not been able to tolerate this in the past and may be why she was not feeling well and stopped her medications.  Also advised to use duloxetine only once daily and that this could help improve her neuropathy pain, but it would never go completely away.  She voiced understanding.  Advised her to bring all of her medications with her when she returns for her next visit.

## 2020-12-16 ENCOUNTER — Ambulatory Visit (INDEPENDENT_AMBULATORY_CARE_PROVIDER_SITE_OTHER): Payer: Self-pay | Admitting: Family Medicine

## 2020-12-16 ENCOUNTER — Other Ambulatory Visit: Payer: Self-pay

## 2020-12-16 ENCOUNTER — Encounter: Payer: Self-pay | Admitting: Family Medicine

## 2020-12-16 VITALS — BP 118/72 | HR 65 | Ht 61.0 in | Wt 131.6 lb

## 2020-12-16 DIAGNOSIS — M25531 Pain in right wrist: Secondary | ICD-10-CM

## 2020-12-16 DIAGNOSIS — E1140 Type 2 diabetes mellitus with diabetic neuropathy, unspecified: Secondary | ICD-10-CM

## 2020-12-16 DIAGNOSIS — Z1231 Encounter for screening mammogram for malignant neoplasm of breast: Secondary | ICD-10-CM

## 2020-12-16 NOTE — Assessment & Plan Note (Signed)
Spent a detailed amount of time working on med reconciliation for patient.  It seems that most of her symptoms are caused by Marcelline Deist, therefore will discontinue.  Given this, we will go ahead and trial her on increased dose of Metformin.  Will increase to Metformin 500 mg twice daily to see if she tolerates this.  In the meantime continue Victoza.  Continue to check CBGs.  We will follow-up with BMP at next visit given reinitiation of some medications.

## 2020-12-16 NOTE — Assessment & Plan Note (Signed)
Again reiterated to patient that she will need to wear her wrist splints to see if these will help with carpal tunnel symptoms.  Also advised stretching of carpal tunnel and advised her and her husband how to perform this.

## 2020-12-16 NOTE — Progress Notes (Signed)
    SUBJECTIVE:   CHIEF COMPLAINT / HPI:   T2DM Current regimen: Farxiga 10 mg daily, Trulicity 0.75 mg daily, Metformin 500 mg daily Previously had difficulty figuring out what medications she is on She brings her medications with her today, but seems like the only medications she is taking are metformin and trulicity At last visit, was told to restart farxiga, but hasn't She has not been taking farxiga as it makes her vomit Doesn't have CBGs with her  Right wrist pain Patient evaluated on 1/19 with carpal tunnel like symptoms She was given a wrist splint to use at night Has not been using much Continues to have some pain in her hands and wants to know what she will be doing  No [place] interpretor available, used iPad [place] interpretor and husband interpreted to [place]  Pap smear due Mammogram due  PERTINENT  PMH / PSH: T2DM, goiter, neuropathy, right wrist pain  OBJECTIVE:   BP 118/72   Pulse 65   Ht 5\' 1"  (1.549 m)   Wt 131 lb 9.6 oz (59.7 kg)   SpO2 99%   BMI 24.87 kg/m    Physical Exam:  General: 55 y.o. female in NAD Lungs: Breathing comfortably on room air Skin: warm and dry Extremities: No edema, b/l hands without obvious deformity   ASSESSMENT/PLAN:   Type 2 diabetes mellitus with diabetic neuropathy, without long-term current use of insulin (HCC) Spent a detailed amount of time working on med reconciliation for patient.  It seems that most of her symptoms are caused by 40, therefore will discontinue.  Given this, we will go ahead and trial her on increased dose of Metformin.  Will increase to Metformin 500 mg twice daily to see if she tolerates this.  In the meantime continue Victoza.  Continue to check CBGs.  We will follow-up with BMP at next visit given reinitiation of some medications.  Right wrist pain Again reiterated to patient that she will need to wear her wrist splints to see if these will help with carpal tunnel symptoms.  Also advised  stretching of carpal tunnel and advised her and her husband how to perform this.   Educated on importance of mammogram and given number to schedule.  Daughter speaks English and can help schedule this.  Also discussed importance of Pap smear, she we will do this at her next visit.  She was advised that this will be the only thing covered at this visit.  Marcelline Deist, DO [facility]

## 2020-12-16 NOTE — Patient Instructions (Addendum)
Thank you for coming to see me today. It was a pleasure. Today we talked about:   Stop [place].  Start taking metformin twice a day.  Work on stretching your hands like I showed you.  I have placed an order for your mammogram.  Please call [facility] at [phone] to schedule your appointment within one week.  We can do your pap smear at your next visit.  Please follow-up with me on 3/17 at 8:30am.  If you have any questions or concerns, please do not hesitate to call the office at [phone].  Best,   Luis Abed, DO

## 2021-01-15 ENCOUNTER — Other Ambulatory Visit: Payer: Self-pay

## 2021-01-15 ENCOUNTER — Ambulatory Visit (INDEPENDENT_AMBULATORY_CARE_PROVIDER_SITE_OTHER): Payer: Self-pay | Admitting: Nurse Practitioner

## 2021-01-15 ENCOUNTER — Encounter: Payer: Self-pay | Admitting: Nurse Practitioner

## 2021-01-15 ENCOUNTER — Other Ambulatory Visit: Payer: Self-pay | Admitting: Nurse Practitioner

## 2021-01-15 VITALS — BP 127/81 | HR 81 | Temp 97.6°F | Ht 59.5 in | Wt 131.0 lb

## 2021-01-15 DIAGNOSIS — F32A Depression, unspecified: Secondary | ICD-10-CM

## 2021-01-15 DIAGNOSIS — Z124 Encounter for screening for malignant neoplasm of cervix: Secondary | ICD-10-CM

## 2021-01-15 DIAGNOSIS — E1140 Type 2 diabetes mellitus with diabetic neuropathy, unspecified: Secondary | ICD-10-CM

## 2021-01-15 DIAGNOSIS — Z9114 Patient's other noncompliance with medication regimen: Secondary | ICD-10-CM

## 2021-01-15 DIAGNOSIS — Z1231 Encounter for screening mammogram for malignant neoplasm of breast: Secondary | ICD-10-CM

## 2021-01-15 LAB — GLUCOSE, POCT (MANUAL RESULT ENTRY): POC Glucose: 280 mg/dl — AB (ref 70–99)

## 2021-01-15 MED ORDER — TRULICITY 1.5 MG/0.5ML ~~LOC~~ SOAJ: 1.5000 mg | SUBCUTANEOUS | 2 refills | Status: DC

## 2021-01-15 MED FILL — ?TRULICITY 1.5 MG/0.5 ML PE: 1.5 | 28 days supply | Qty: 2 | Fill #0

## 2021-01-15 NOTE — Patient Instructions (Signed)
Health Maintenance, Female Adopting a healthy lifestyle and getting preventive care are important in promoting health and wellness. Ask your health care provider about:  The right schedule for you to have regular tests and exams.  Diabetes Mellitus and Nutrition, Adult When you have diabetes, or diabetes mellitus, it is very important to have healthy eating habits because your blood sugar (glucose) levels are greatly affected by what you eat and drink. Eating healthy foods in the right amounts, at about the same times every day, can help you: Control your blood glucose. Lower your risk of heart disease. Improve your blood pressure. Reach or maintain a healthy weight. What can affect my meal plan? Every person with diabetes is different, and each person has different needs for a meal plan. Your health care provider may recommend that you work with a dietitian to make a meal plan that is best for you. Your meal plan may vary depending on factors such as: The calories you need. The medicines you take. Your weight. Your blood glucose, blood pressure, and cholesterol levels. Your activity level. Other health conditions you have, such as heart or kidney disease. How do carbohydrates affect me? Carbohydrates, also called carbs, affect your blood glucose level more than any other type of food. Eating carbs naturally raises the amount of glucose in your blood. Carb counting is a method for keeping track of how many carbs you eat. Counting carbs is important to keep your blood glucose at a healthy level, especially if you use insulin or take certain oral diabetes medicines. It is important to know how many carbs you can safely have in each meal. This is different for every person. Your dietitian can help you calculate how many carbs you should have at each meal and for each snack. How does alcohol affect me? Alcohol can cause a sudden decrease in blood glucose (hypoglycemia), especially if you use  insulin or take certain oral diabetes medicines. Hypoglycemia can be a life-threatening condition. Symptoms of hypoglycemia, such as sleepiness, dizziness, and confusion, are similar to symptoms of having too much alcohol. Do not drink alcohol if: Your health care provider tells you not to drink. You are pregnant, may be pregnant, or are planning to become pregnant. If you drink alcohol: Do not drink on an empty stomach. Limit how much you use to: 0-1 drink a day for women. 0-2 drinks a day for men. Be aware of how much alcohol is in your drink. In the [place] one drink equals one 12 oz bottle of beer (355 mL), one 5 oz glass of wine (148 mL), or one 1 oz glass of hard liquor (44 mL). Keep yourself hydrated with water, diet soda, or unsweetened iced tea. Keep in mind that regular soda, juice, and other mixers may contain a lot of sugar and must be counted as carbs. What are tips for following this plan? Reading food labels Start by checking the serving size on the "Nutrition Facts" label of packaged foods and drinks. The amount of calories, carbs, fats, and other nutrients listed on the label is based on one serving of the item. Many items contain more than one serving per package. Check the total grams (g) of carbs in one serving. You can calculate the number of servings of carbs in one serving by dividing the total carbs by 15. For example, if a food has 30 g of total carbs per serving, it would be equal to 2 servings of carbs. Check the number of grams (g)  of saturated fats and trans fats in one serving. Choose foods that have a low amount or none of these fats. Check the number of milligrams (mg) of salt (sodium) in one serving. Most people should limit total sodium intake to less than 2,300 mg per day. Always check the nutrition information of foods labeled as "low-fat" or "nonfat." These foods may be higher in added sugar or refined carbs and should be avoided. Talk to your dietitian to  identify your daily goals for nutrients listed on the label. Shopping Avoid buying canned, pre-made, or processed foods. These foods tend to be high in fat, sodium, and added sugar. Shop around the outside edge of the grocery store. This is where you will most often find fresh fruits and vegetables, bulk grains, fresh meats, and fresh dairy. Cooking Use low-heat cooking methods, such as baking, instead of high-heat cooking methods like deep frying. Cook using healthy oils, such as olive, canola, or sunflower oil. Avoid cooking with butter, cream, or high-fat meats. Meal planning Eat meals and snacks regularly, preferably at the same times every day. Avoid going long periods of time without eating. Eat foods that are high in fiber, such as fresh fruits, vegetables, beans, and whole grains. Talk with your dietitian about how many servings of carbs you can eat at each meal. Eat 4-6 oz (112-168 g) of lean protein each day, such as lean meat, chicken, fish, eggs, or tofu. One ounce (oz) of lean protein is equal to: 1 oz (28 g) of meat, chicken, or fish. 1 egg.  cup (62 g) of tofu. Eat some foods each day that contain healthy fats, such as avocado, nuts, seeds, and fish.   What foods should I eat? Fruits Berries. Apples. Oranges. Peaches. Apricots. Plums. Grapes. Mango. Papaya. Pomegranate. Kiwi. Cherries. Vegetables Lettuce. Spinach. Leafy greens, including kale, chard, collard greens, and mustard greens. Beets. Cauliflower. Cabbage. Broccoli. Carrots. Green beans. Tomatoes. Peppers. Onions. Cucumbers. Brussels sprouts. Grains Whole grains, such as whole-wheat or whole-grain bread, crackers, tortillas, cereal, and pasta. Unsweetened oatmeal. Quinoa. Brown or wild rice. Meats and other proteins Seafood. Poultry without skin. Lean cuts of poultry and beef. Tofu. Nuts. Seeds. Dairy Low-fat or fat-free dairy products such as milk, yogurt, and cheese. The items listed above may not be a complete  list of foods and beverages you can eat. Contact a dietitian for more information. What foods should I avoid? Fruits Fruits canned with syrup. Vegetables Canned vegetables. Frozen vegetables with butter or cream sauce. Grains Refined white flour and flour products such as bread, pasta, snack foods, and cereals. Avoid all processed foods. Meats and other proteins Fatty cuts of meat. Poultry with skin. Breaded or fried meats. Processed meat. Avoid saturated fats. Dairy Full-fat yogurt, cheese, or milk. Beverages Sweetened drinks, such as soda or iced tea. The items listed above may not be a complete list of foods and beverages you should avoid. Contact a dietitian for more information. Questions to ask a health care provider Do I need to meet with a diabetes educator? Do I need to meet with a dietitian? What number can I call if I have questions? When are the best times to check my blood glucose? Where to find more information: American Diabetes Association: diabetes.org Academy of Nutrition and Dietetics: www.eatright.[employer] of Diabetes and Digestive and Kidney Diseases: CarFlippers.tn Association of Diabetes Care and Education Specialists: www.diabeteseducator.org Summary It is important to have healthy eating habits because your blood sugar (glucose) levels are greatly affected by  what you eat and drink. A healthy meal plan will help you control your blood glucose and maintain a healthy lifestyle. Your health care provider may recommend that you work with a dietitian to make a meal plan that is best for you. Keep in mind that carbohydrates (carbs) and alcohol have immediate effects on your blood glucose levels. It is important to count carbs and to use alcohol carefully. This information is not intended to replace advice given to you by your health care provider. Make sure you discuss any questions you have with your health care provider. Document Revised:  09/26/2019 Document Reviewed: 09/26/2019 Elsevier Patient Education  2021 Elsevier Inc.  Things you can do on your own to prevent diseases and keep yourself healthy. What should I know about diet, weight, and exercise? Eat a healthy diet  Eat a diet that includes plenty of vegetables, fruits, low-fat dairy products, and lean protein.  Do not eat a lot of foods that are high in solid fats, added sugars, or sodium.   Maintain a healthy weight Body mass index (BMI) is used to identify weight problems. It estimates body fat based on height and weight. Your health care provider can help determine your BMI and help you achieve or maintain a healthy weight. Get regular exercise Get regular exercise. This is one of the most important things you can do for your health. Most adults should:  Exercise for at least 150 minutes each week. The exercise should increase your heart rate and make you sweat (moderate-intensity exercise).  Do strengthening exercises at least twice a week. This is in addition to the moderate-intensity exercise.  Spend less time sitting. Even light physical activity can be beneficial. Watch cholesterol and blood lipids Have your blood tested for lipids and cholesterol at 54 years of age, then have this test every 5 years. Have your cholesterol levels checked more often if:  Your lipid or cholesterol levels are high.  You are older than 54 years of age.  You are at high risk for heart disease. What should I know about cancer screening? Depending on your health history and family history, you may need to have cancer screening at various ages. This may include screening for:  Breast cancer.  Cervical cancer.  Colorectal cancer.  Skin cancer.  Lung cancer. What should I know about heart disease, diabetes, and high blood pressure? Blood pressure and heart disease  High blood pressure causes heart disease and increases the risk of stroke. This is more likely to  develop in people who have high blood pressure readings, are of African descent, or are overweight.  Have your blood pressure checked: ? Every 3-5 years if you are 42-54 years of age. ? Every year if you are 85 years old or older. Diabetes Have regular diabetes screenings. This checks your fasting blood sugar level. Have the screening done:  Once every three years after age 65 if you are at a normal weight and have a low risk for diabetes.  More often and at a younger age if you are overweight or have a high risk for diabetes. What should I know about preventing infection? Hepatitis B If you have a higher risk for hepatitis B, you should be screened for this virus. Talk with your health care provider to find out if you are at risk for hepatitis B infection. Hepatitis C Testing is recommended for:  Everyone born from [birth year] through 1964.  Anyone with known risk factors for hepatitis C. Sexually transmitted  infections (STIs)  Get screened for STIs, including gonorrhea and chlamydia, if: ? You are sexually active and are younger than 54 years of age. ? You are older than 53 years of age and your health care provider tells you that you are at risk for this type of infection. ? Your sexual activity has changed since you were last screened, and you are at increased risk for chlamydia or gonorrhea. Ask your health care provider if you are at risk.  Ask your health care provider about whether you are at high risk for HIV. Your health care provider may recommend a prescription medicine to help prevent HIV infection. If you choose to take medicine to prevent HIV, you should first get tested for HIV. You should then be tested every 3 months for as long as you are taking the medicine. Pregnancy  If you are about to stop having your period (premenopausal) and you may become pregnant, seek counseling before you get pregnant.  Take 400 to 800 micrograms (mcg) of folic acid every day if you become  pregnant.  Ask for birth control (contraception) if you want to prevent pregnancy. Osteoporosis and menopause Osteoporosis is a disease in which the bones lose minerals and strength with aging. This can result in bone fractures. If you are 31 years old or older, or if you are at risk for osteoporosis and fractures, ask your health care provider if you should:  Be screened for bone loss.  Take a calcium or vitamin D supplement to lower your risk of fractures.  Be given hormone replacement therapy (HRT) to treat symptoms of menopause. Follow these instructions at home: Lifestyle  Do not use any products that contain nicotine or tobacco, such as cigarettes, e-cigarettes, and chewing tobacco. If you need help quitting, ask your health care provider.  Do not use street drugs.  Do not share needles.  Ask your health care provider for help if you need support or information about quitting drugs. Alcohol use  Do not drink alcohol if: ? Your health care provider tells you not to drink. ? You are pregnant, may be pregnant, or are planning to become pregnant.  If you drink alcohol: ? Limit how much you use to 0-1 drink a day. ? Limit intake if you are breastfeeding.  Be aware of how much alcohol is in your drink. In the [place].S., one drink equals one 12 oz bottle of beer (355 mL), one 5 oz glass of wine (148 mL), or one 1 oz glass of hard liquor (44 mL). General instructions  Schedule regular health, dental, and eye exams.  Stay current with your vaccines.  Tell your health care provider if: ? You often feel depressed. ? You have ever been abused or do not feel safe at home. Summary  Adopting a healthy lifestyle and getting preventive care are important in promoting health and wellness.  Follow your health care provider's instructions about healthy diet, exercising, and getting tested or screened for diseases.  Follow your health care provider's instructions on monitoring your  cholesterol and blood pressure. This information is not intended to replace advice given to you by your health care provider. Make sure you discuss any questions you have with your health care provider. Document Revised: 10/12/2018 Document Reviewed: 10/12/2018 Elsevier Patient Education  2021 [employer].

## 2021-01-15 NOTE — Progress Notes (Signed)
[facility] Patient [facility] [address] [place], [place]  [address] Phone:  [phone]   Fax:  [phone]   New Patient Office Visit  Subjective:  Patient ID: Shelley Bartlett, female    DOB: 1966-04-16  Age: 55 y.o. MRN: [id number]  CC:  Chief Complaint  Patient presents with  . New Patient (Initial Visit)    Est care , hx of dm, stop insulin,the doctor told to stop , not medication, having blurred ,burning and tingling hands and feet     HPI Shelley Bartlett presents to establish care.  She is in today with her daughter who was going to interpret however interpreter was obtained via [employer].  She  has a past medical history of Diabetes (HCC) and Goiter.   She was previous patient of [facility]. She admits that her Medicaid expired and she was directed here.   Diabetes Mellitus Patient presents for follow up of diabetes. Current symptoms include: hyperglycemia, nausea, paresthesia of the feet and vomitting. Symptoms have progressed to a point and plateaued. Patient denies foot ulcerations, increased appetite, polydipsia and polyuria. Evaluation to date has included: fasting blood sugar, fasting lipid panel and hemoglobin A1C.  Home sugars: BGs range between 340. Current treatment: Discontinued metformin which has been discontinued because of side effects, Discontinued statin which has been discontinued because of side effects and Discontinued Trulicity which has been Unable to afford. She is not taking any medication. She ran out of her medication. The medication is making her dizziness; Trucility 0.75mg . She was on the injection for 2 months. She took medication last month.    She now admits that the duloxetine and all other pills makes her dizziness. She feels like this occurred 3 months after starting he medication. She admits that the last day she vomited was February.  She is concern about the numbness and tingling. She feels like it is getting worse.     Past Medical History:  Diagnosis Date  . Diabetes (HCC)   . Goiter     History reviewed. No pertinent surgical history.  History reviewed. No pertinent family history.  Social History   Socioeconomic History  . Marital status: Married    Spouse name: Illene Labrador  . Number of children: 5  . Years of education: Not on file  . Highest education level: Not on file  Occupational History  . Not on file  Tobacco Use  . Smoking status: Never Smoker  . Smokeless tobacco: Never Used  Substance and Sexual Activity  . Alcohol use: Never  . Drug use: Never  . Sexual activity: Not on file  Other Topics Concern  . Not on file  Social History Narrative  . Not on file   Social Determinants of Health   Financial Resource Strain: Not on file  Food Insecurity: Not on file  Transportation Needs: Not on file  Physical Activity: Not on file  Stress: Not on file  Social Connections: Not on file  Intimate Partner Violence: Not on file    ROS Review of Systems  Constitutional: Positive for appetite change (decreased) and unexpected weight change (3 or 4 kg).  Eyes: Positive for visual disturbance.       Eye tears  Respiratory: Negative for chest tightness and shortness of breath.   Cardiovascular: Negative for chest pain and leg swelling.  Gastrointestinal: Positive for constipation (no tx BM twice weekly. She admits that this is new, ) and nausea (with medication). Negative for vomiting.  Genitourinary: Negative for dysuria and frequency.  Musculoskeletal: Negative.   Skin: Negative.   Neurological: Positive for numbness (feet).  Hematological: Negative.     Objective:   Today's Vitals: BP 127/81   Pulse 81   Temp 97.6 F (36.4 C) (Temporal)   Ht 4' 11.5" (1.511 m)   Wt 131 lb (59.4 kg)   SpO2 96%   BMI 26.02 kg/m   Physical Exam Constitutional:      Comments: Poor historian   HENT:     Head: Normocephalic and atraumatic.     Nose: Nose normal.     Mouth/Throat:      Mouth: Mucous membranes are moist.  Cardiovascular:     Rate and Rhythm: Normal rate and regular rhythm.     Pulses: Normal pulses.     Heart sounds: Normal heart sounds.  Pulmonary:     Effort: Pulmonary effort is normal.     Breath sounds: Normal breath sounds.  Musculoskeletal:     Cervical back: Normal range of motion.  Skin:    General: Skin is warm and dry.     Capillary Refill: Capillary refill takes less than 2 seconds.  Neurological:     General: No focal deficit present.     Mental Status: She is alert and oriented to person, place, and time.  Psychiatric:        Mood and Affect: Mood normal.        Behavior: Behavior normal.     Assessment & Plan:   Problem List Items Addressed This Visit      Endocrine   Type 2 diabetes mellitus with diabetic neuropathy, without long-term current use of insulin (HCC) - Primary Encourage compliance with current treatment regimen with long discussion  Dose adjustment increased Trulciity 1.5 mg Encourage regular CBG monitoring Encourage contacting office if excessive hyperglycemia and or hypoglycemia Lifestyle modification with healthy diet (fewer calories, more high fiber foods, whole grains and non-starchy vegetables, lower fat meat and fish, low-fat diary include healthy oils) regular exercise (physical activity) and weight loss Opthalmology exam discussed  Nutritional consult recommended Regular dental visits encouraged Home BP monitoring also encouraged goal <130/80     Relevant Medications   Dulaglutide (TRULICITY) 1.5 MG/0.5ML SOPN   Other Relevant Orders   Microalbumin/Creatinine Ratio, Urine   POCT Urinalysis Dipstick   Glucose (CBG) (Completed)   Ambulatory referral to diabetic education   Comp. Metabolic Panel (12) (Completed)    Other Visit Diagnoses    Depression, unspecified depression type     Worsening encouraged to restart duloxetine and will increase slowly in the future to optimize results and to hopefully  help with neuropathy    Nonadherence to medication     Discussed treatment   Pap smear for cervical cancer screening       Encounter for screening mammogram for malignant neoplasm of breast          Outpatient Encounter Medications as of 01/15/2021  Medication Sig  . Accu-Chek Softclix Lancets lancets 1 each by Other route in the morning, at noon, in the evening, and at bedtime. Use as instructed  . Dulaglutide (TRULICITY) 1.5 MG/0.5ML SOPN Inject 1.5 mg into the skin once a week.  Marland Kitchen atorvastatin (LIPITOR) 20 MG tablet Take 1 tablet (20 mg total) by mouth daily. (Patient not taking: Reported on 01/15/2021)  . DULoxetine (CYMBALTA) 30 MG capsule Take 2 capsules (60 mg total) by mouth daily. (Patient not taking: Reported on 01/15/2021)  . glucose blood (ACCU-CHEK  GUIDE) test strip Use four times daily to check blood sugars. (Patient not taking: Reported on 01/15/2021)  . lidocaine (LIDODERM) 5 % Place 1 patch onto the skin daily. Remove & Discard patch within 12 hours or as directed by MD (Patient not taking: Reported on 01/15/2021)  . [DISCONTINUED] dapagliflozin propanediol (FARXIGA) 10 MG TABS tablet Take 1 tablet (10 mg total) by mouth daily. (Patient not taking: No sig reported)  . [DISCONTINUED] Dulaglutide (TRULICITY) 0.75 MG/0.5ML SOPN Inject 0.75 mg into the skin once a week. (Patient not taking: Reported on 01/15/2021)  . [DISCONTINUED] meclizine (ANTIVERT) 25 MG tablet Take 1 tablet (25 mg total) by mouth 3 (three) times daily as needed for dizziness. (Patient not taking: Reported on 01/15/2021)  . [DISCONTINUED] metFORMIN (GLUCOPHAGE-XR) 500 MG 24 hr tablet Take 1 tablet (500 mg total) by mouth daily with breakfast. (Patient not taking: Reported on 01/15/2021)   No facility-administered encounter medications on file as of 01/15/2021.    Follow-up: Return in about 4 weeks (around 02/12/2021) for follow up [id number] med change.   Barbette Merino, NP

## 2021-01-16 ENCOUNTER — Ambulatory Visit: Payer: Self-pay | Admitting: Family Medicine

## 2021-01-16 LAB — COMP. METABOLIC PANEL (12)
AST: 13 IU/L (ref 0–40)
Albumin/Globulin Ratio: 1.7 (ref 1.2–2.2)
Albumin: 4.1 g/dL (ref 3.8–4.9)
Alkaline Phosphatase: 77 IU/L (ref 44–121)
BUN/Creatinine Ratio: 15 (ref 9–23)
BUN: 9 mg/dL (ref 6–24)
Bilirubin Total: 0.3 mg/dL (ref 0.0–1.2)
Calcium: 8.7 mg/dL (ref 8.7–10.2)
Chloride: 105 mmol/L (ref 96–106)
Creatinine, Ser: 0.61 mg/dL (ref 0.57–1.00)
Globulin, Total: 2.4 g/dL (ref 1.5–4.5)
Glucose: 279 mg/dL — ABNORMAL HIGH (ref 65–99)
Potassium: 4.1 mmol/L (ref 3.5–5.2)
Sodium: 140 mmol/L (ref 134–144)
Total Protein: 6.5 g/dL (ref 6.0–8.5)
eGFR: 107 mL/min/{1.73_m2} (ref >59)

## 2021-01-20 ENCOUNTER — Encounter: Payer: Self-pay | Admitting: Clinical

## 2021-01-20 ENCOUNTER — Ambulatory Visit: Payer: Self-pay | Admitting: Nurse Practitioner

## 2021-01-20 DIAGNOSIS — Z5989 Other problems related to housing and economic circumstances: Secondary | ICD-10-CM

## 2021-01-20 NOTE — Progress Notes (Signed)
Integrated Behavioral Health Case Management Referral Note  01/20/2021 Name: Shelley Bartlett MRN: [id number] DOB: 12-25-1965 Shelley Bartlett is a 55 y.o. year old female who presented on 01/15/21 to establish care with Dionisio David, NP. LCSW was consulted to assess patient's needs and assist the patient with Financial Difficulties related to lack of health coverage and low income.  Interpreter: Yes.     Interpreter Name & Language: [place]  Assessment: Patient experiencing financial difficulties realted to lack of health coverage and low income.   Intervention: Patient and husband were referred to [facility]) by [employer]. Patient previously had temporary Medicaid but this coverage has expired.  CSW met with patient, patient's husband Shelley Bartlett, and daughter with interpreter. Referred patient and husband to [employer] [facility]) for assistance with enrolling in [place] insurance coverage during current special enrollment period. Patient and husband also interested in applying for [employer]  and for [employer] if they turn out to not be eligible for [facility] plan or cannot afford one.   Provided patient with [employer] and [employer] applications. Reviewed application and advised patient on supporting documents to be submitted with the application. Advised patient to follow up with CSW for assistance in scheduling appointment with financial counselor at [facility] and [facility]) to submit the application if needed. Provided CSW contact information.   Review of patient status, including review of consultants reports, relevant laboratory and other test results, and collaboration with appropriate care team members and the patient's provider was performed as part of comprehensive patient evaluation and provision of services.    SDOH (Social Determinants of Health) assessments performed: No    Goals Addressed    None      Follow up Plan: 1. CSW to follow up with patient after contact with [facility] and determine if they would still like to apply for [employer] or not. 2. CSW available from clinic as needed.   Estanislado Emms, [facility] [phone]

## 2021-01-21 ENCOUNTER — Ambulatory Visit: Payer: Self-pay | Admitting: Family Medicine

## 2021-01-27 IMAGING — US US THYROID
1 series · 13 of 25 positions shown · non-contrast
Comparison: None.

CLINICAL DATA: 53-year-old female with thyroid goiter

EXAM:
THYROID ULTRASOUND
TECHNIQUE: Ultrasound examination of the thyroid gland and adjacent soft
tissues was performed.

[Series 1: us thyroid · 65 acquisitions, 13 frames shown]
[im 1/65]
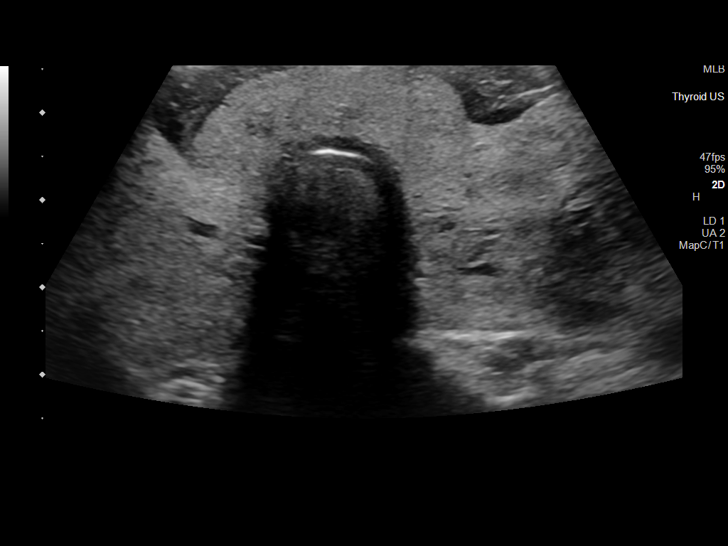
[im 6/65]
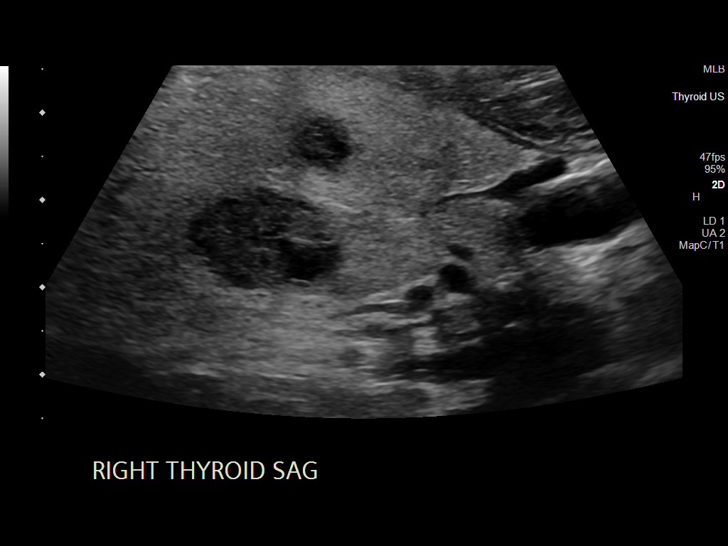
[im 11/65]
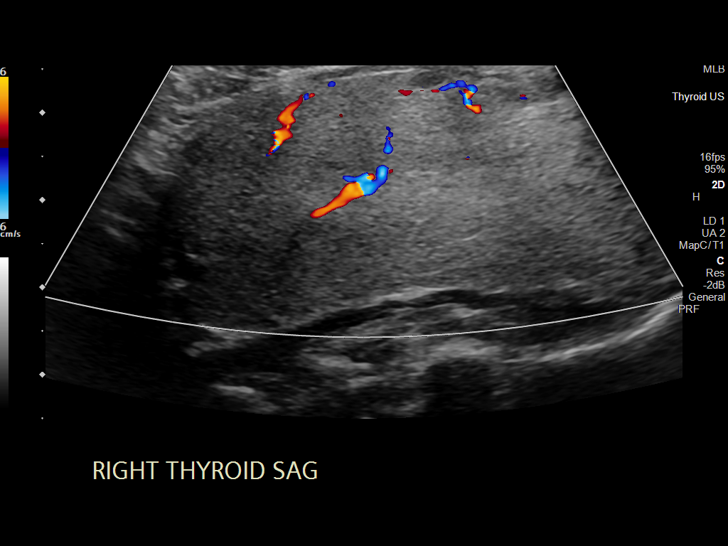
[im 17/65]
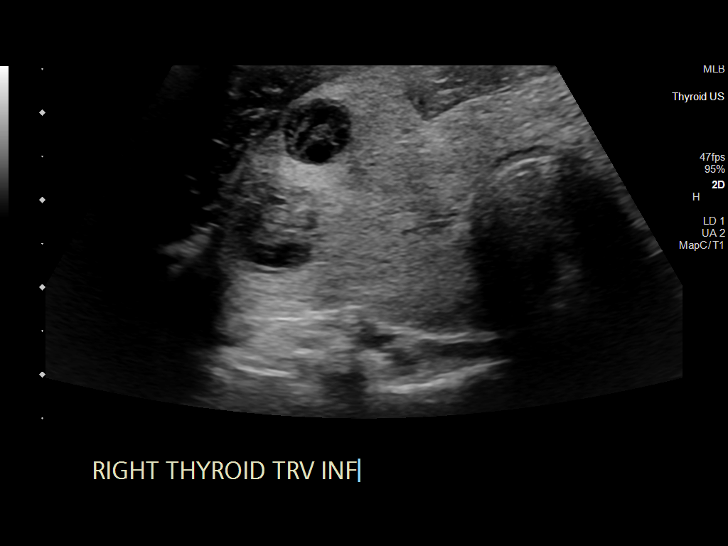
[im 22/65]
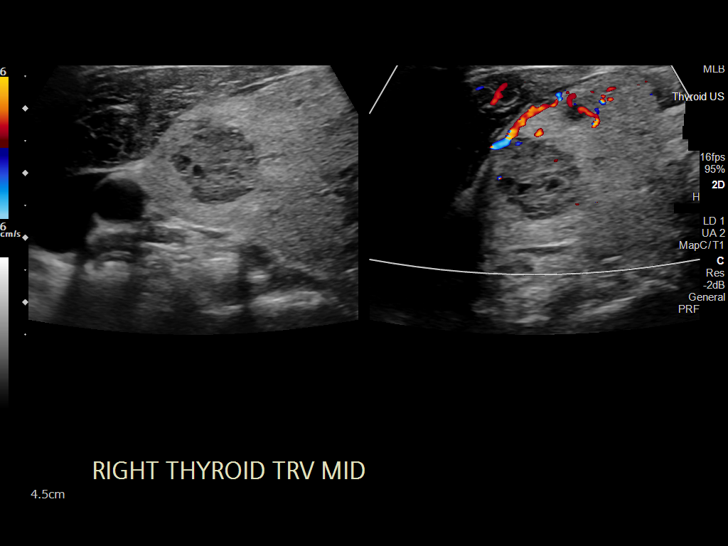
[im 27/65]
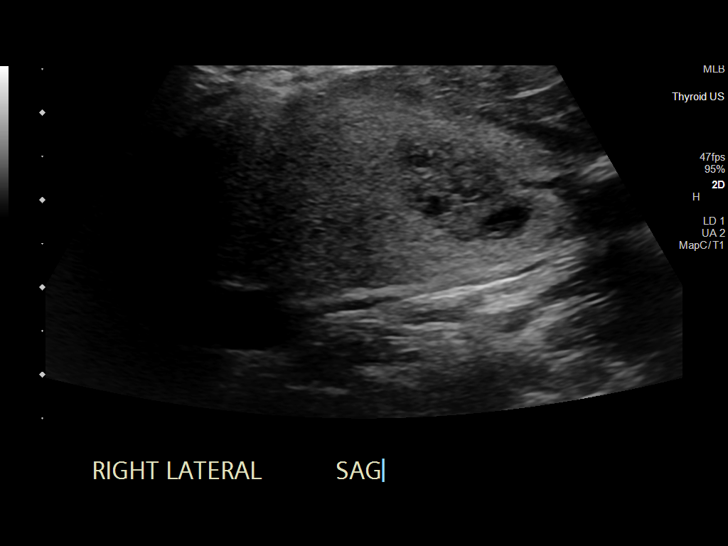
[im 33/65]
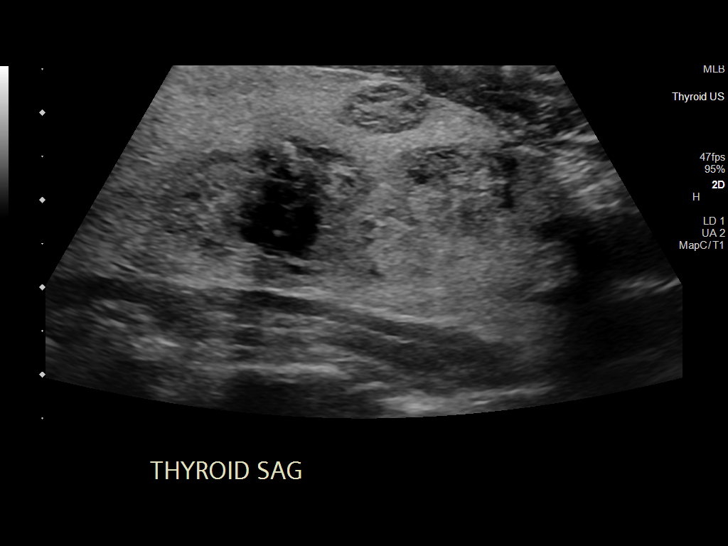
[im 38/65]
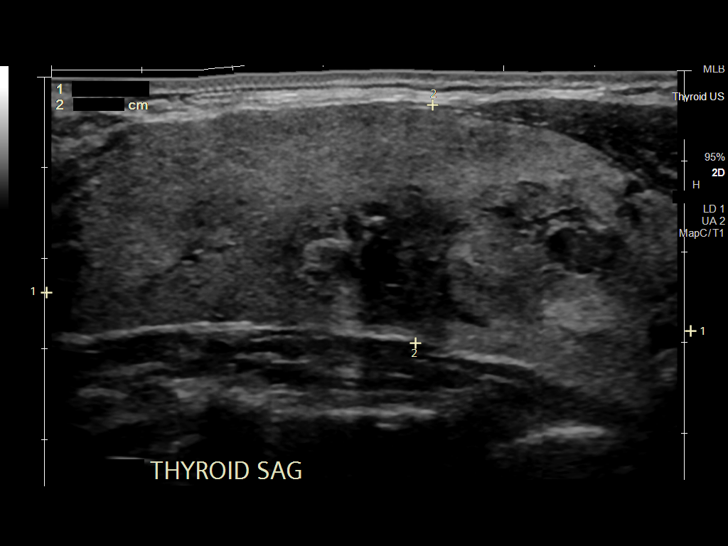
[im 43/65]
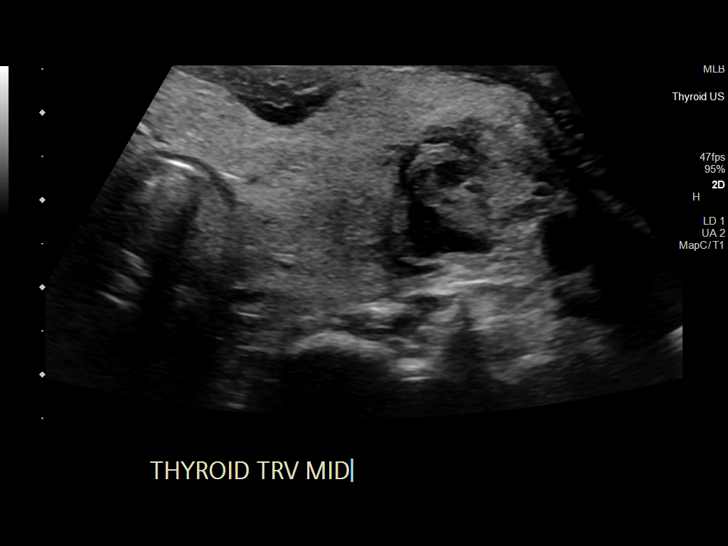
[im 49/65]
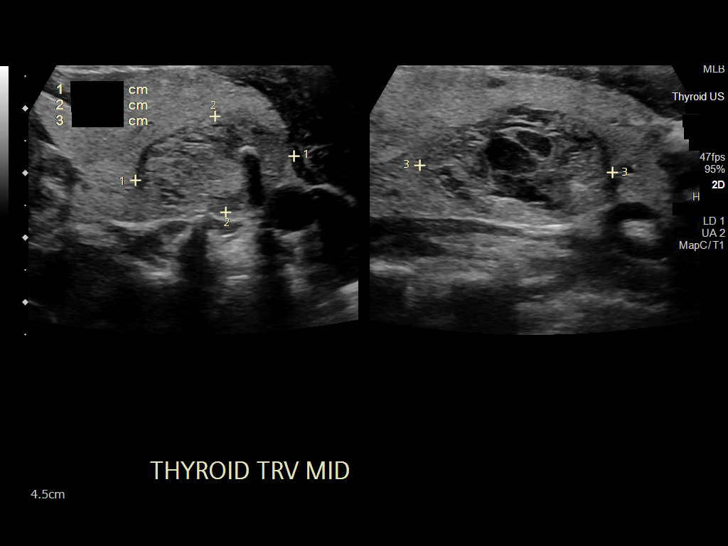
[im 54/65]
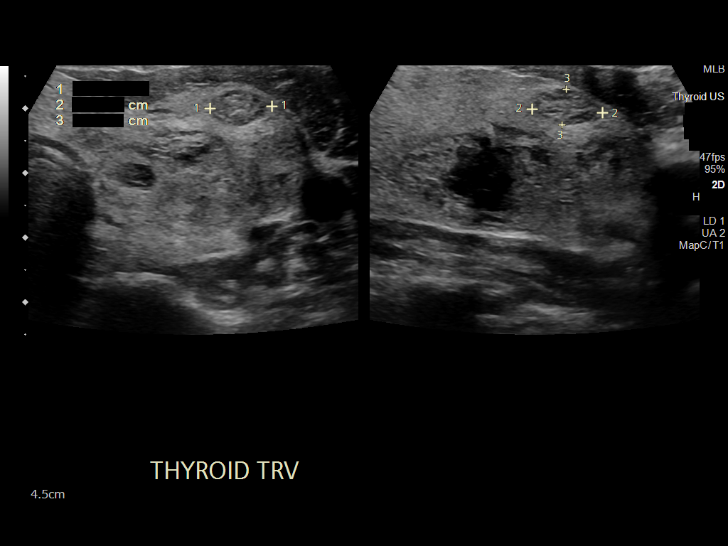
[im 59/65]
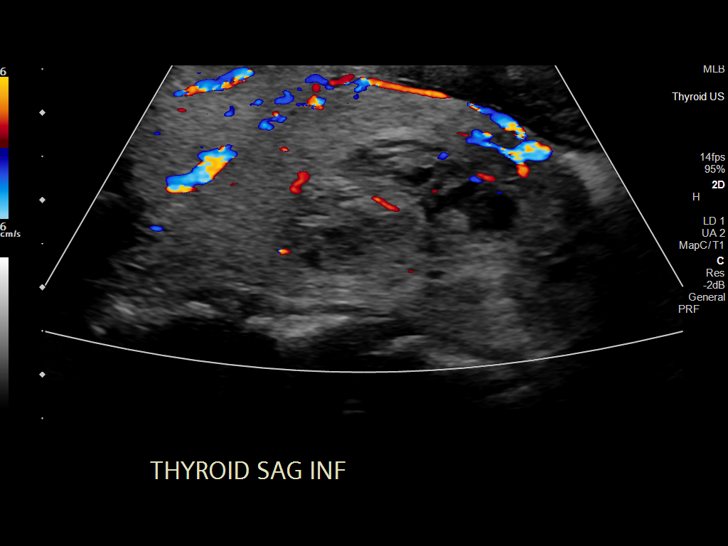
[im 65/65]
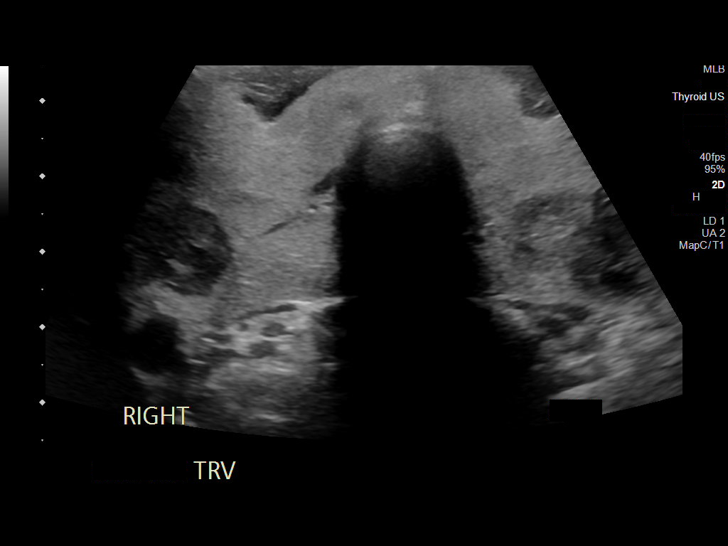

[13 of 25 positions shown; findings below may reference images not displayed]

FINDINGS: Parenchymal Echotexture: Mildly heterogenous

Isthmus: 0.9 cm

Right lobe: 7.3 cm x 2.6 cm x 3.4 cm

Left lobe: 7.1 cm x 2.6 cm x 4.0 cm

_________________________________________________________

Estimated total number of nodules >/= 1 cm: 4

Number of spongiform nodules >/=  2 cm not described below (TR1): 0

Number of mixed cystic and solid nodules >/= 1.5 cm not described
below (TR2): 0

_________________________________________________________

Nodule # 1:

Location: Right; Mid

Maximum size: 1.7 cm; Other 2 dimensions: 1.5 cm x 1.3 cm

Composition: spongiform (0)

ACR TI-RADS recommendations:

Spongiform nodule does not meet criteria for surveillance or biopsy

_________________________________________________________

Nodule # 2:

Location: Right; Inferior

Maximum size: 0.9 cm; Other 2 dimensions: 0.9 cm x 0.7 cm

Composition: spongiform (0)

ACR TI-RADS recommendations:

Spongiform nodule does not meet criteria for surveillance or biopsy

_________________________________________________________

Nodule # 3:

Location: Left; Mid

Maximum size: 3.0 cm; Other 2 dimensions: 2.5 cm x 1.5 cm

Composition: cannot determine (2)

Echogenicity: isoechoic (1)

Shape: not taller-than-wide (0)

Margins: smooth (0)

Echogenic foci: macrocalcifications (1)

ACR TI-RADS total points: 4.

ACR TI-RADS risk category: TR4 (4-6 points).

ACR TI-RADS recommendations:

Nodule meets criteria for biopsy

_________________________________________________________

Nodule # 4:

Location: Left; Mid

Maximum size: 1.1 cm; Other 2 dimensions: 1.0 cm x 0.6 cm

Composition: spongiform (0)

ACR TI-RADS recommendations:

Spongiform nodule does not meet criteria for surveillance or biopsy

_________________________________________________________

Nodule # 5:

Location: Left; Inferior

Maximum size: 2.9 cm; Other 2 dimensions: 2.6 cm x 1.5 cm

Composition: spongiform (0)

ACR TI-RADS recommendations:

Spongiform nodule does not meet criteria for surveillance or biopsy

_________________________________________________________

No adenopathy
IMPRESSION: Multinodular thyroid.

Left mid thyroid nodule (labeled 3, TR 4, 3.0 cm) meets criteria for
biopsy, as designated by the newly established ACR TI-RADS criteria,
and referral for biopsy is recommended.

Recommendations follow those established by the new ACR TI-RADS
criteria ([HOSPITAL] 9686;[DATE]).

## 2021-02-12 ENCOUNTER — Other Ambulatory Visit: Payer: Self-pay

## 2021-02-12 ENCOUNTER — Encounter: Payer: Self-pay | Admitting: Nurse Practitioner

## 2021-02-12 ENCOUNTER — Ambulatory Visit (INDEPENDENT_AMBULATORY_CARE_PROVIDER_SITE_OTHER): Payer: Self-pay | Admitting: Nurse Practitioner

## 2021-02-12 VITALS — BP 134/89 | HR 89 | Temp 97.3°F | Ht 59.5 in | Wt 134.0 lb

## 2021-02-12 DIAGNOSIS — E1140 Type 2 diabetes mellitus with diabetic neuropathy, unspecified: Secondary | ICD-10-CM

## 2021-02-12 LAB — GLUCOSE, POCT (MANUAL RESULT ENTRY): POC Glucose: 163 mg/dl — AB (ref 70–99)

## 2021-02-12 MED ORDER — GABAPENTIN 100 MG PO CAPS
100.0000 mg | ORAL_CAPSULE | Freq: Two times a day (BID) | ORAL | 3 refills | Status: DC
  Filled 2021-02-12: qty 90, 45d supply, fill #0

## 2021-02-12 MED ORDER — GABAPENTIN 100 MG PO CAPS
ORAL_CAPSULE | ORAL | 3 refills | Status: DC
  Filled 2021-02-12: qty 30, 30d supply, fill #0

## 2021-02-12 MED ORDER — TRULICITY 1.5 MG/0.5ML ~~LOC~~ SOAJ
1.5000 mg | SUBCUTANEOUS | 3 refills | Status: DC
  Filled 2021-02-12 (×2): qty 2, 28d supply, fill #0
  Filled 2021-03-13: qty 2, 28d supply, fill #1
  Filled 2021-04-07: qty 2, 28d supply, fill #2
  Filled 2021-05-07: qty 2, 28d supply, fill #3
  Filled 2021-06-03: qty 2, 28d supply, fill #4
  Filled 2021-07-02: qty 2, 28d supply, fill #5
  Filled 2021-07-31: qty 2, 28d supply, fill #6
  Filled 2021-08-25: qty 2, 28d supply, fill #7
  Filled 2021-09-24: qty 2, 28d supply, fill #8
  Filled 2021-10-30: qty 2, 28d supply, fill #9
  Filled 2021-11-25: qty 2, 28d supply, fill #0
  Filled 2021-12-26: qty 2, 28d supply, fill #1

## 2021-02-12 NOTE — Progress Notes (Signed)
[facility] Patient [facility] [address] [place], [place]  [address] Phone:  [phone]   Fax:  [phone]    Established Patient Office Visit  Subjective:  Patient ID: Shelley Bartlett, female    DOB: 08/28/1966  Age: 55 y.o. MRN: [id number]  CC:  Chief Complaint  Patient presents with  . Follow-up    Follow up diabetes , pt has been  running 167 range  , stopped the diabetes medication it was  making her dizzy     HPI Shelley Bartlett presents for follow-up.  She  has a past medical history of Diabetes (HCC) and Goiter.   She is in today with her daughter. She is following up on her diabetes.  She admits that her home CBG has improved however she did not bring in her readings.  It is currently too early to repeat her A1c.  She denies any side effects of the Trulicity.  She does continue to have paresthesias in her hands and feet.  She has failed gabapentin and duloxetine in the past.  She is seeking additional treatment.  She is willing to restart the gabapentin low-dose and slowly taper.  Past Medical History:  Diagnosis Date  . Diabetes (HCC)   . Goiter     History reviewed. No pertinent surgical history.  History reviewed. No pertinent family history.  Social History   Socioeconomic History  . Marital status: Married    Spouse name: Illene Bartlett  . Number of children: 5  . Years of education: Not on file  . Highest education level: Not on file  Occupational History  . Not on file  Tobacco Use  . Smoking status: Never Smoker  . Smokeless tobacco: Never Used  Substance and Sexual Activity  . Alcohol use: Never  . Drug use: Never  . Sexual activity: Not on file  Other Topics Concern  . Not on file  Social History Narrative  . Not on file   Social Determinants of Health   Financial Resource Strain: Not on file  Food Insecurity: Not on file  Transportation Needs: Not on file  Physical Activity: Not on file  Stress: Not on file  Social  Connections: Not on file  Intimate Partner Violence: Not on file    Outpatient Medications Prior to Visit  Medication Sig Dispense Refill  . Accu-Chek Softclix Lancets lancets 1 each by Other route in the morning, at noon, in the evening, and at bedtime. Use as instructed 100 each 12  . glucose blood (ACCU-CHEK GUIDE) test strip Use four times daily to check blood sugars. 100 each 12  . Dulaglutide (TRULICITY) 1.5 MG/0.5ML SOPN Inject 1.5 mg into the skin once a week. 2 mL 2  . Dulaglutide 1.5 MG/0.5ML SOPN INJECT 1.5 MG INTO THE SKIN ONCE A WEEK. 2 mL 2  . lidocaine (LIDODERM) 5 % Place 1 patch onto the skin daily. Remove & Discard patch within 12 hours or as directed by MD 30 patch 0  . atorvastatin (LIPITOR) 20 MG tablet Take 1 tablet (20 mg total) by mouth daily. (Patient not taking: Reported on 01/15/2021) 90 tablet 3  . DULoxetine (CYMBALTA) 30 MG capsule Take 2 capsules (60 mg total) by mouth daily. (Patient not taking: Reported on 01/15/2021) 30 capsule 3   No facility-administered medications prior to visit.    No Known Allergies  ROS Review of Systems    Objective:    Physical Exam HENT:     Head: Normocephalic  and atraumatic.     Nose: Nose normal.  Cardiovascular:     Rate and Rhythm: Normal rate and regular rhythm.     Pulses: Normal pulses.     Heart sounds: Normal heart sounds.  Pulmonary:     Effort: Pulmonary effort is normal.     Breath sounds: Normal breath sounds.  Musculoskeletal:        General: Normal range of motion.     Cervical back: Normal range of motion.     Right lower leg: No edema.     Left lower leg: No edema.  Skin:    General: Skin is warm and dry.     Capillary Refill: Capillary refill takes less than 2 seconds.  Neurological:     General: No focal deficit present.     Mental Status: She is alert and oriented to person, place, and time.     BP 134/89 (BP Location: Left Arm, Patient Position: Sitting, Cuff Size: Normal)   Pulse 89    Temp (!) 97.3 F (36.3 C) (Temporal)   Ht 4' 11.5" (1.511 m)   Wt 134 lb (60.8 kg)   SpO2 99%   BMI 26.61 kg/m  Wt Readings from Last 3 Encounters:  02/12/21 134 lb (60.8 kg)  01/15/21 131 lb (59.4 kg)  12/16/20 131 lb 9.6 oz (59.7 kg)     Health Maintenance Due  Topic Date Due  . OPHTHALMOLOGY EXAM  Never done  . URINE MICROALBUMIN  Never done  . PAP SMEAR-Modifier  Never done  . MAMMOGRAM  Never done    There are no preventive care reminders to display for this patient.  Lab Results  Component Value Date   TSH 0.913 05/07/2020   Lab Results  Component Value Date   WBC 4.4 10/04/2020   HGB 14.2 10/04/2020   HCT 43.2 10/04/2020   MCV 83.1 10/04/2020   PLT 154 10/04/2020   Lab Results  Component Value Date   NA 140 01/15/2021   K 4.1 01/15/2021   CO2 19 (L) 10/14/2020   GLUCOSE 279 (H) 01/15/2021   BUN 9 01/15/2021   CREATININE 0.61 01/15/2021   BILITOT 0.3 01/15/2021   ALKPHOS 77 01/15/2021   AST 13 01/15/2021   ALT 23 05/07/2020   PROT 6.5 01/15/2021   ALBUMIN 4.1 01/15/2021   CALCIUM 8.7 01/15/2021   ANIONGAP 12 10/04/2020   Lab Results  Component Value Date   CHOL 166 05/07/2020   Lab Results  Component Value Date   HDL 47 05/07/2020   Lab Results  Component Value Date   LDLCALC 103 (H) 05/07/2020   Lab Results  Component Value Date   TRIG 84 05/07/2020   Lab Results  Component Value Date   CHOLHDL 3.5 05/07/2020   Lab Results  Component Value Date   HGBA1C 7.6 (A) 11/20/2020      Assessment & Plan:   Problem List Items Addressed This Visit      Endocrine   Type 2 diabetes mellitus with diabetic neuropathy, without long-term current use of insulin (HCC) - Primary Encourage compliance with current treatment regimen we will restart gabapentin 100 mg nightly for 2 weeks and then may increase to 200 mg nightly until follow-up visit. Encourage regular CBG monitoring Encourage contacting office if excessive hyperglycemia and or  hypoglycemia Lifestyle modification with healthy diet (fewer calories, more high fiber foods, whole grains and non-starchy vegetables, lower fat meat and fish, low-fat diary include healthy oils) regular exercise (physical activity)  Relevant Medications   Dulaglutide (TRULICITY) 1.5 MG/0.5ML SOPN   Other Relevant Orders   Glucose (CBG) (Completed)      Meds ordered this encounter  Medications  . Dulaglutide (TRULICITY) 1.5 MG/0.5ML SOPN    Sig: Inject 1.5 mg into the skin once a week.    Dispense:  6 mL    Refill:  3    Order Specific Question:   Supervising Provider    Answer:   Quentin Angst [id number]  . DISCONTD: gabapentin (NEURONTIN) 100 MG capsule    Sig: Take 1 capsule (100 mg total) by mouth 2 (two) times daily.    Dispense:  90 capsule    Refill:  3    Order Specific Question:   Supervising Provider    Answer:   Quentin Angst [id number]  . gabapentin (NEURONTIN) 100 MG capsule    Sig: Take 1 capsule (100 mg total) by mouth at bedtime for 14 days, THEN 1 capsule (100 mg total) at bedtime.    Dispense:  90 capsule    Refill:  3    Order Specific Question:   Supervising Provider    Answer:   Quentin Angst [[id number]]    Follow-up: Return in about 3 months (around 05/14/2021).    Barbette Merino, NP

## 2021-02-12 NOTE — Progress Notes (Signed)
Established Patient Office Visit  Subjective:  Patient ID: Shelley Bartlett, female    DOB: 01/19/1966  Age: 55 y.o. MRN: [id number]  CC:  Chief Complaint  Patient presents with  . Follow-up    Follow up diabetes , pt has been  running 167 range  , stopped the diabetes medication it was  making her dizzy     HPI Shelley Bartlett presents for Diabetes II follow up. The patient states that she has been checking blood sugars at home with results in the 160's. She eats traditional foods and avoids sugars as much as possible. She denies participating in physical activity. She reports being unable to take Metformin and Atorvastatin due to feeling of dizziness. She endorses intermittent visual changes described as "dark spots in the eyes". Her last eye exam was in August 2021. Today she complains of burning in bilateral feet for 5 months at night. She is not taking any OTC medications to assist with burring symptom but puts feet in hot water and that seems to help.   Past Medical History:  Diagnosis Date  . Diabetes (HCC)   . Goiter     No past surgical history on file.  No family history on file.  Social History   Socioeconomic History  . Marital status: Married    Spouse name: Illene Labrador  . Number of children: 5  . Years of education: Not on file  . Highest education level: Not on file  Occupational History  . Not on file  Tobacco Use  . Smoking status: Never Smoker  . Smokeless tobacco: Never Used  Substance and Sexual Activity  . Alcohol use: Never  . Drug use: Never  . Sexual activity: Not on file  Other Topics Concern  . Not on file  Social History Narrative  . Not on file   Social Determinants of Health   Financial Resource Strain: Not on file  Food Insecurity: Not on file  Transportation Needs: Not on file  Physical Activity: Not on file  Stress: Not on file  Social Connections: Not on file  Intimate Partner Violence: Not on file    Outpatient  Medications Prior to Visit  Medication Sig Dispense Refill  . Accu-Chek Softclix Lancets lancets 1 each by Other route in the morning, at noon, in the evening, and at bedtime. Use as instructed 100 each 12  . Dulaglutide (TRULICITY) 1.5 MG/0.5ML SOPN Inject 1.5 mg into the skin once a week. 2 mL 2  . Dulaglutide 1.5 MG/0.5ML SOPN INJECT 1.5 MG INTO THE SKIN ONCE A WEEK. 2 mL 2  . glucose blood (ACCU-CHEK GUIDE) test strip Use four times daily to check blood sugars. 100 each 12  . lidocaine (LIDODERM) 5 % Place 1 patch onto the skin daily. Remove & Discard patch within 12 hours or as directed by MD 30 patch 0  . atorvastatin (LIPITOR) 20 MG tablet Take 1 tablet (20 mg total) by mouth daily. (Patient not taking: Reported on 01/15/2021) 90 tablet 3  . DULoxetine (CYMBALTA) 30 MG capsule Take 2 capsules (60 mg total) by mouth daily. (Patient not taking: Reported on 01/15/2021) 30 capsule 3   No facility-administered medications prior to visit.    No Known Allergies  ROS Review of Systems  Constitutional: Negative.  Negative for chills and fever.  HENT: Negative.   Eyes: Positive for visual disturbance (intermittent dark spots. denies pain or pressure ). Negative for photophobia.  Respiratory: Negative.  Negative for cough, chest tightness,  shortness of breath, wheezing and stridor.   Cardiovascular: Negative.  Negative for chest pain, palpitations and leg swelling.  Gastrointestinal: Negative.  Negative for abdominal pain, constipation, diarrhea, nausea and vomiting.  Endocrine: Negative.  Negative for cold intolerance, heat intolerance, polydipsia, polyphagia and polyuria.  Genitourinary: Negative.  Negative for dysuria, frequency and urgency.  Musculoskeletal: Negative.  Negative for back pain, gait problem and myalgias.  Skin: Negative.  Negative for rash.  Neurological: Negative for weakness, light-headedness and headaches.  Hematological: Negative.  Does not bruise/bleed easily.   Psychiatric/Behavioral: Negative.  Negative for suicidal ideas. The patient is not nervous/anxious.       Objective:    Physical Exam Constitutional:      Appearance: Normal appearance. She is normal weight.  Eyes:     Extraocular Movements: Extraocular movements intact.     Conjunctiva/sclera: Conjunctivae normal.     Pupils: Pupils are equal, round, and reactive to light.  Cardiovascular:     Rate and Rhythm: Normal rate and regular rhythm.     Pulses: Normal pulses.  Pulmonary:     Effort: Pulmonary effort is normal. No respiratory distress.     Breath sounds: Normal breath sounds. No stridor. No wheezing, rhonchi or rales.  Abdominal:     General: Abdomen is flat. Bowel sounds are normal. There is no distension.     Palpations: Abdomen is soft.     Tenderness: There is no abdominal tenderness.     Hernia: No hernia is present.  Musculoskeletal:     Right lower leg: No edema.     Left lower leg: No edema.  Skin:    General: Skin is warm and dry.     Capillary Refill: Capillary refill takes less than 2 seconds.  Neurological:     General: No focal deficit present.     Mental Status: She is alert and oriented to person, place, and time. Mental status is at baseline.  Psychiatric:        Mood and Affect: Mood normal.        Behavior: Behavior normal.        Thought Content: Thought content normal.        Judgment: Judgment normal.     BP 134/89 (BP Location: Left Arm, Patient Position: Sitting, Cuff Size: Normal)   Pulse 89   Temp (!) 97.3 F (36.3 C) (Temporal)   Ht 4' 11.5" (1.511 m)   Wt 134 lb (60.8 kg)   SpO2 99%   BMI 26.61 kg/m  Wt Readings from Last 3 Encounters:  02/12/21 134 lb (60.8 kg)  01/15/21 131 lb (59.4 kg)  12/16/20 131 lb 9.6 oz (59.7 kg)     Health Maintenance Due  Topic Date Due  . PNEUMOCOCCAL POLYSACCHARIDE VACCINE AGE 50-64 HIGH RISK  Never done  . COVID-19 Vaccine (1) Never done  . OPHTHALMOLOGY EXAM  Never done  . URINE  MICROALBUMIN  Never done  . PAP SMEAR-Modifier  Never done  . COLONOSCOPY (Pts 45-103yrs Insurance coverage will need to be confirmed)  Never done  . MAMMOGRAM  Never done    There are no preventive care reminders to display for this patient.  Lab Results  Component Value Date   TSH 0.913 05/07/2020   Lab Results  Component Value Date   WBC 4.4 10/04/2020   HGB 14.2 10/04/2020   HCT 43.2 10/04/2020   MCV 83.1 10/04/2020   PLT 154 10/04/2020   Lab Results  Component Value Date  NA 140 01/15/2021   K 4.1 01/15/2021   CO2 19 (L) 10/14/2020   GLUCOSE 279 (H) 01/15/2021   BUN 9 01/15/2021   CREATININE 0.61 01/15/2021   BILITOT 0.3 01/15/2021   ALKPHOS 77 01/15/2021   AST 13 01/15/2021   ALT 23 05/07/2020   PROT 6.5 01/15/2021   ALBUMIN 4.1 01/15/2021   CALCIUM 8.7 01/15/2021   ANIONGAP 12 10/04/2020   Lab Results  Component Value Date   CHOL 166 05/07/2020   Lab Results  Component Value Date   HDL 47 05/07/2020   Lab Results  Component Value Date   LDLCALC 103 (H) 05/07/2020   Lab Results  Component Value Date   TRIG 84 05/07/2020   Lab Results  Component Value Date   CHOLHDL 3.5 05/07/2020   Lab Results  Component Value Date   HGBA1C 7.6 (A) 11/20/2020      Assessment & Plan:  DM-recommendation Glimepiride 2 mg daily, not agreeable today Urine Microalbuminuria Burning legs and feet- recommendation gabapentin Continue healthy eating habits F/u 3 months A1C and foot exam Problem List Items Addressed This Visit      Endocrine   Type 2 diabetes mellitus with diabetic neuropathy, without long-term current use of insulin (HCC) - Primary   Relevant Orders   Glucose (CBG) (Completed)      No orders of the defined types were placed in this encounter.   Follow-up: No follow-ups on file.   Federico Flake, RN

## 2021-02-12 NOTE — Patient Instructions (Signed)
Trigger Finger  Trigger finger, also called stenosing tenosynovitis,  is a condition that causes a finger to get stuck in a bent position. Each finger has a tendon, which is a tough, cord-like tissue that connects muscle to bone, and each tendon passes through a tunnel of tissue called a tendon sheath. To move your finger, your tendon needs to glide freely through the sheath. Trigger finger happens when the tendon or the sheath thickens, making it difficult to move your finger. Trigger finger can affect any finger or a thumb. It may affect more than one finger. Mild cases may clear up with rest and medicine. Severe cases require more treatment. What are the causes? Trigger finger is caused by a thickened finger tendon or tendon sheath. The cause of this thickening is not known. What increases the risk? The following factors may make you more likely to develop this condition:  Doing activities that require a strong grip.  Having rheumatoid arthritis, gout, or diabetes.  Being 40-60 years old.  Being female. What are the signs or symptoms? Symptoms of this condition include:  Pain when bending or straightening your finger.  Tenderness or swelling where your finger attaches to the palm of your hand.  A lump in the palm of your hand or on the inside of your finger.  Hearing a noise like a pop or a snap when you try to straighten your finger.  Feeling a catching or locking sensation when you try to straighten your finger.  Being unable to straighten your finger. How is this diagnosed? This condition is diagnosed based on your symptoms and a physical exam. How is this treated? This condition may be treated by:  Resting your finger and avoiding activities that make symptoms worse.  Wearing a finger splint to keep your finger extended.  Taking NSAIDs, such as ibuprofen, to relieve pain and swelling.  Doing gentle exercises to stretch the finger as told by your health care  provider.  Having medicine that reduces swelling and inflammation (steroids) injected into the tendon sheath. Injections may need to be repeated.  Having surgery to open the tendon sheath. This may be done if other treatments do not work and you cannot straighten your finger. You may need physical therapy after surgery. Follow these instructions at home: If you have a splint:  Wear the splint as told by your health care provider. Remove it only as told by your health care provider.  Loosen it if your fingers tingle, become numb, or turn cold and blue.  Keep it clean.  If the splint is not waterproof: ? Do not let it get wet. ? Cover it with a watertight covering when you take a bath or shower. Managing pain, stiffness, and swelling If directed, apply heat to the affected area as often as told by your health care provider. Use the heat source that your health care provider recommends, such as a moist heat pack or a heating pad.  Place a towel between your skin and the heat source.  Leave the heat on for 20-30 minutes.  Remove the heat if your skin turns bright red. This is especially important if you are unable to feel pain, heat, or cold. You may have a greater risk of getting burned. If directed, put ice on the painful area. To do this:  If you have a removable splint, remove it as told by your health care provider.  Put ice in a plastic bag.  Place a towel between your skin   and the bag or between your splint and the bag.  Leave the ice on for 20 minutes, 2-3 times a day.      Activity  Rest your finger as told by your health care provider. Avoid activities that make the pain worse.  Return to your normal activities as told by your health care provider. Ask your health care provider what activities are safe for you.  Do exercises as told by your health care provider.  Ask your health care provider when it is safe to drive if you have a splint on your hand. General  instructions  Take over-the-counter and prescription medicines only as told by your health care provider.  Keep all follow-up visits as told by your health care provider. This is important. Contact a health care provider if:  Your symptoms are not improving with home care. Summary  Trigger finger, also called stenosing tenosynovitis, causes your finger to get stuck in a bent position. This can make it difficult and painful to straighten your finger.  This condition develops when a finger tendon or tendon sheath thickens.  Treatment may include resting your finger, wearing a splint, and taking medicines.  In severe cases, surgery to open the tendon sheath may be needed. This information is not intended to replace advice given to you by your health care provider. Make sure you discuss any questions you have with your health care provider. Document Revised: 03/06/2019 Document Reviewed: 03/06/2019 [employer]vier Patient Education  2021 [employer]  

## 2021-02-13 ENCOUNTER — Other Ambulatory Visit: Payer: Self-pay

## 2021-02-25 IMAGING — US US FNA BIOPSY THYROID 1ST LESION
1 series · 13 of 17 positions shown · non-contrast
Comparison: US THYROID 05/22/2020

MEDICATIONS:
2 mL 1% lidocaine

COMPLICATIONS:
None immediate.

INDICATION: Indeterminate thyroid nodule of the left mid thyroid. Request is
made for fine-needle aspiration of indeterminate thyroid nodule.

EXAM:
ULTRASOUND GUIDED FINE NEEDLE ASPIRATION OF INDETERMINATE THYROID
NODULE
TECHNIQUE: Informed written consent was obtained from the patient after a
discussion of the risks, benefits and alternatives to treatment.
Questions regarding the procedure were encouraged and answered. A
timeout was performed prior to the initiation of the procedure.

[Series 1: us fna biopsy thyroid 1st lesion · 0.06mm/px · 17 acquisitions, 13 frames shown]
[im 1/17]
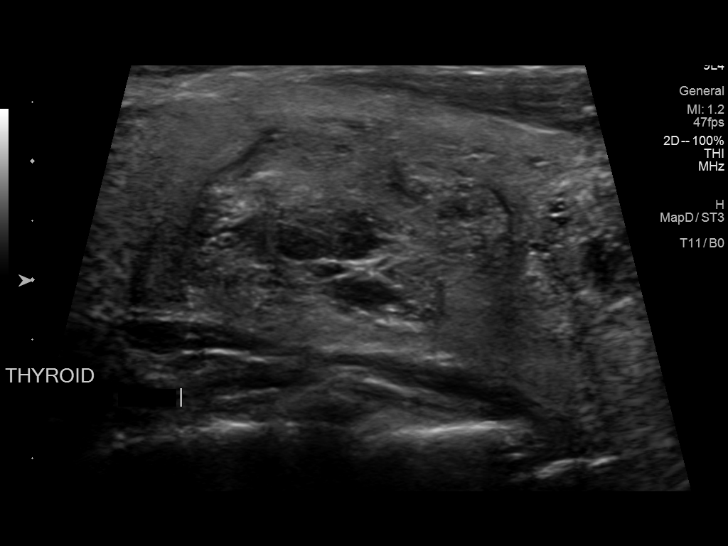
[im 2/17]
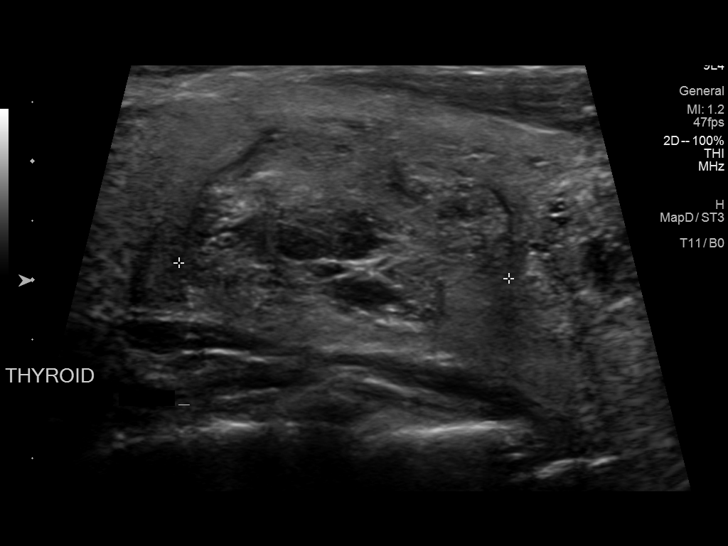
[im 4/17]
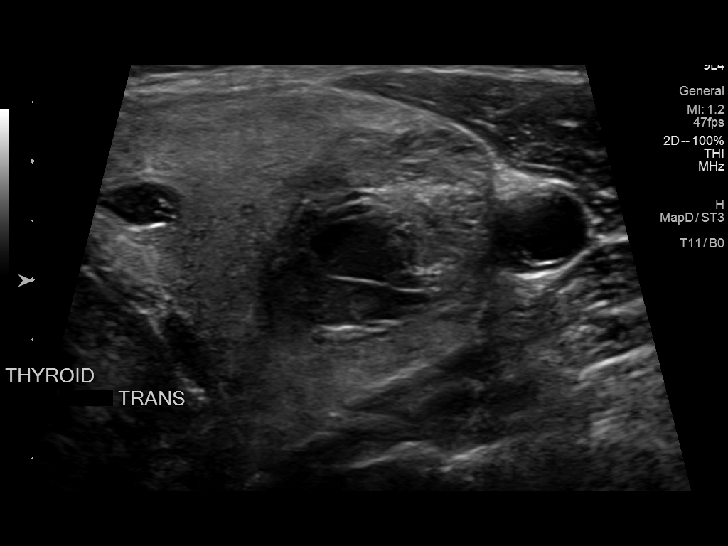
[im 5/17]
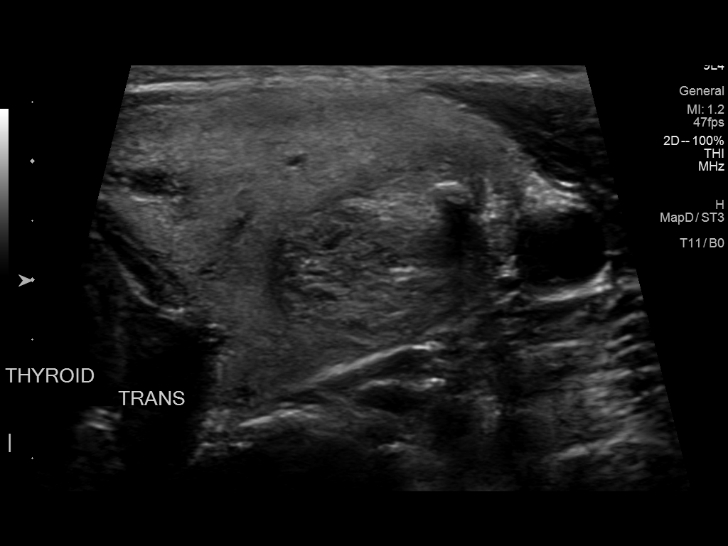
[im 6/17]
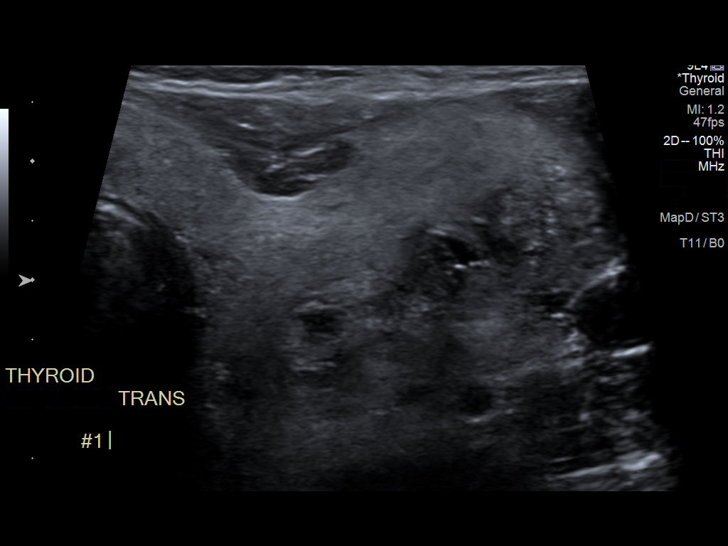
[im 8/17]
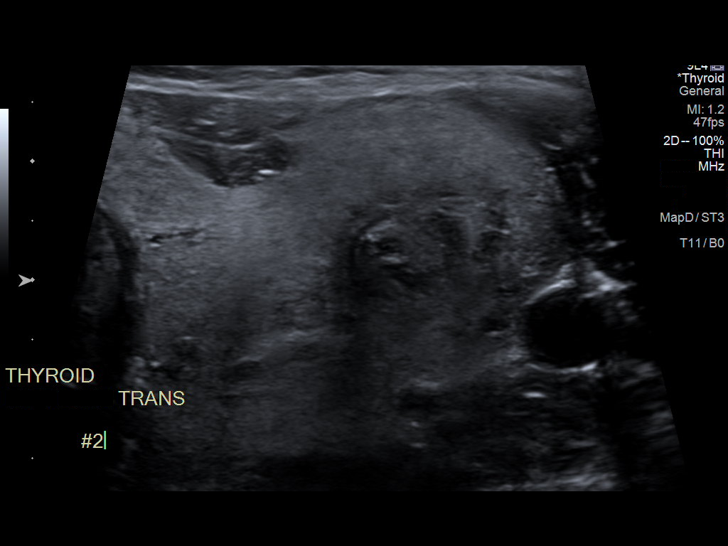
[im 9/17]
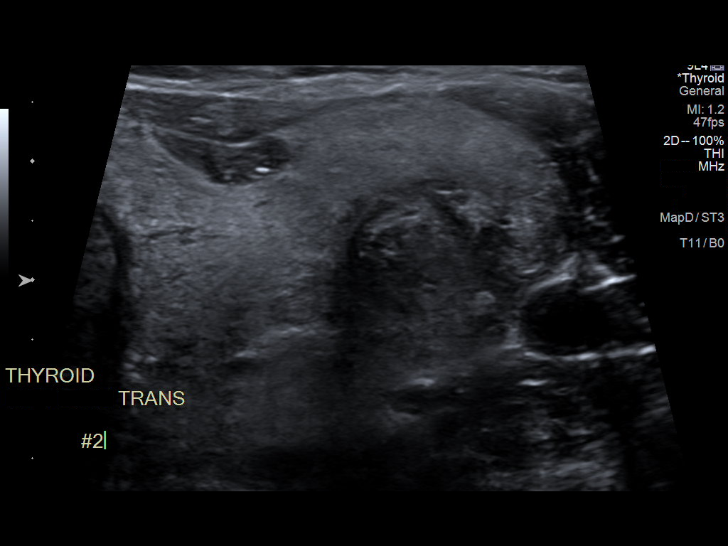
[im 10/17]
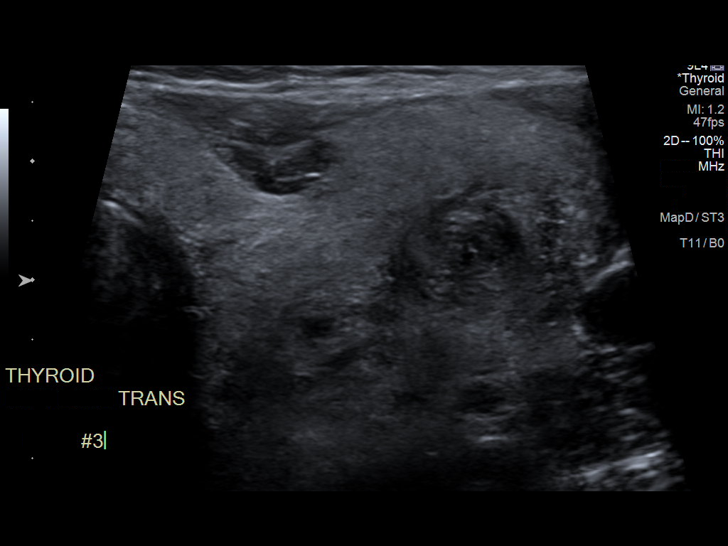
[im 12/17]
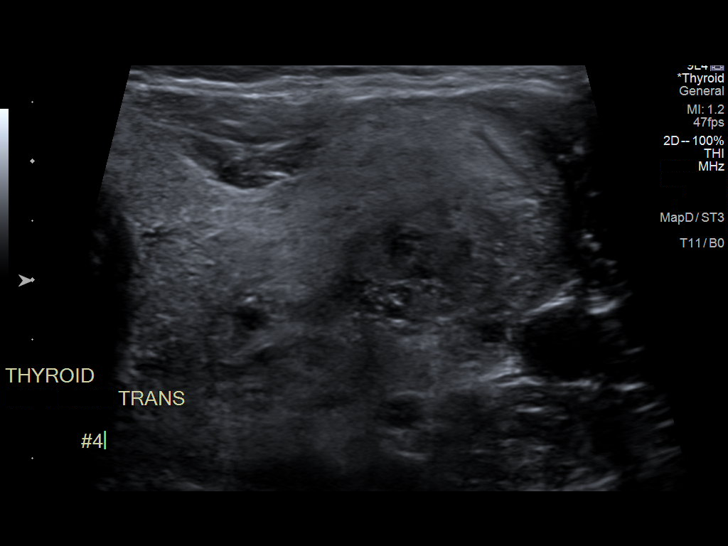
[im 13/17]
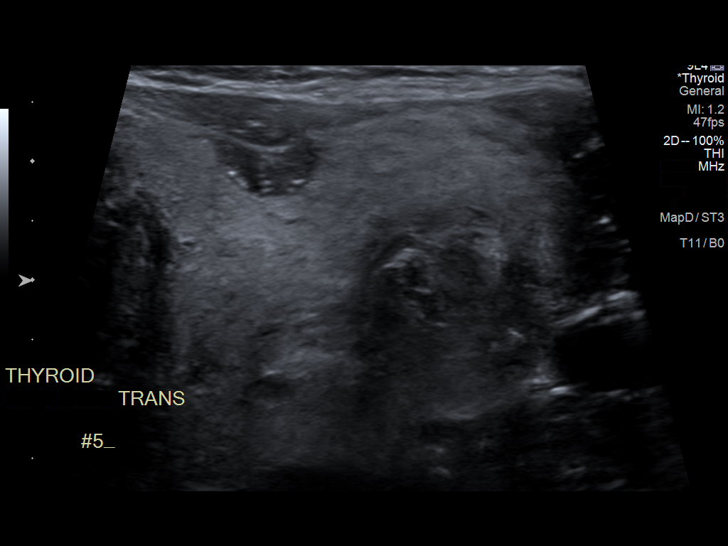
[im 14/17]
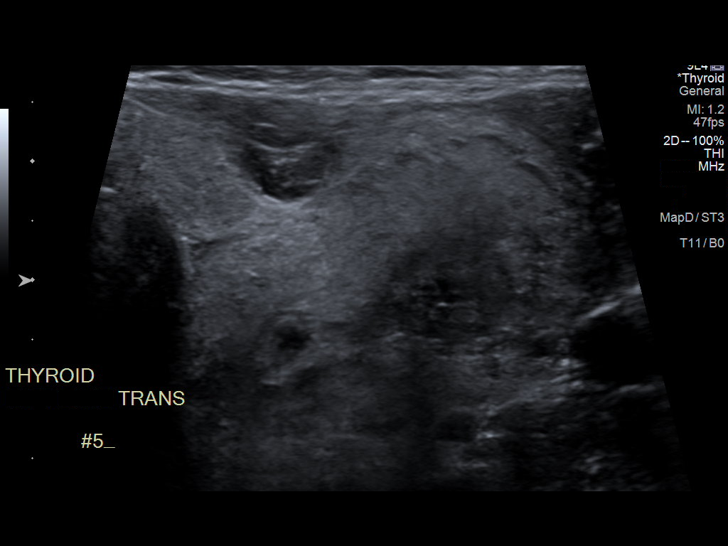
[im 16/17]
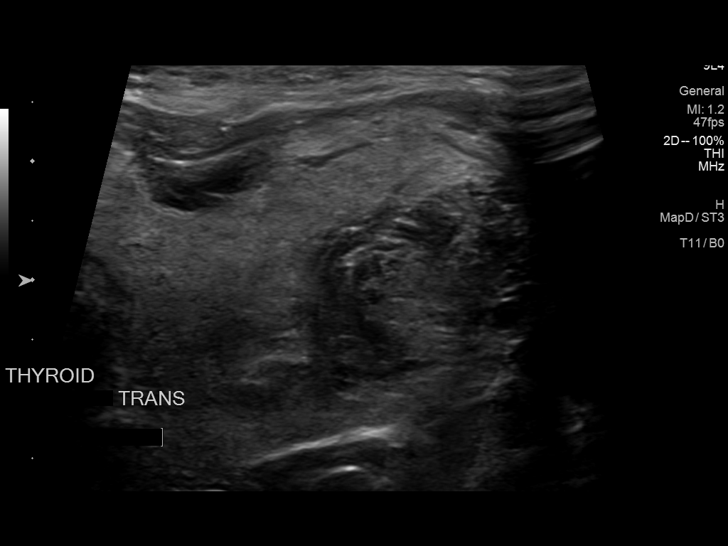
[im 17/17]
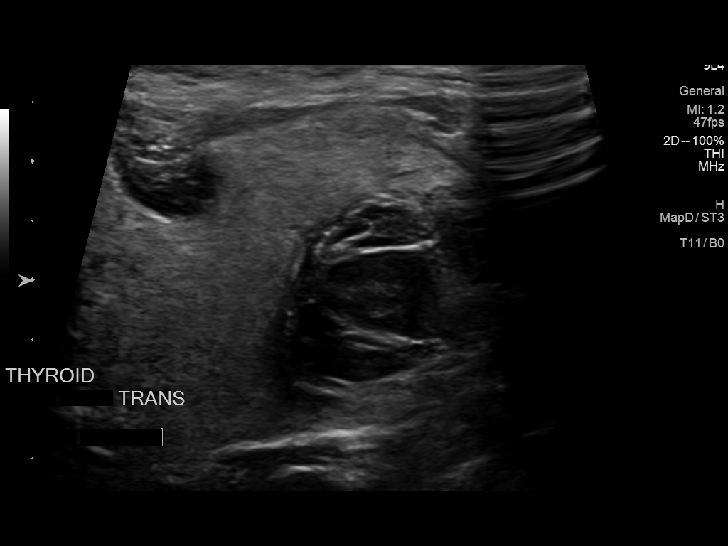

[13 of 17 positions shown; findings below may reference images not displayed]

Pre-procedural ultrasound scanning demonstrated unchanged size and
appearance of the indeterminate nodule within the left mid thyroid.

The procedure was planned. The neck was prepped in the usual sterile
fashion, and a sterile drape was applied covering the operative
field. A timeout was performed prior to the initiation of the
procedure. Local anesthesia was provided with 1% lidocaine.

Under direct ultrasound guidance, 5 FNA biopsies were performed of
the left mid thyroid nodule with a 25 gauge needle. Multiple
ultrasound images were saved for procedural documentation purposes.
The samples were prepared and submitted to pathology. Sample was
also prepared for Afirma testing.

Limited post procedural scanning was negative for hematoma or
additional complication. Dressings were placed. The patient
tolerated the above procedures procedure well without immediate
postprocedural complication.
FINDINGS: Nodule reference number based on prior diagnostic ultrasound: 3

Maximum size: 3.0 cm

Location: Left; Mid

ACR TI-RADS risk category: TR4 (4-6 points)

Reason for biopsy: meets ACR TI-RADS criteria

Ultrasound imaging confirms appropriate placement of the needles
within the thyroid nodule.
IMPRESSION: Technically successful ultrasound guided fine needle aspiration of
indeterminate left mid thyroid nodule.

## 2021-03-06 ENCOUNTER — Other Ambulatory Visit: Payer: Self-pay

## 2021-03-10 ENCOUNTER — Other Ambulatory Visit: Payer: Self-pay

## 2021-03-13 ENCOUNTER — Other Ambulatory Visit: Payer: Self-pay

## 2021-04-07 ENCOUNTER — Other Ambulatory Visit: Payer: Self-pay

## 2021-04-08 ENCOUNTER — Other Ambulatory Visit: Payer: Self-pay

## 2021-04-17 ENCOUNTER — Encounter (HOSPITAL_COMMUNITY): Payer: Self-pay

## 2021-04-17 ENCOUNTER — Other Ambulatory Visit: Payer: Self-pay

## 2021-04-17 ENCOUNTER — Ambulatory Visit (HOSPITAL_COMMUNITY)
Admission: EM | Admit: 2021-04-17 | Discharge: 2021-04-17 | Disposition: A | Discharge status: Home / Self Care | Payer: Self-pay | Attending: Physician Assistant | Admitting: Physician Assistant

## 2021-04-17 DIAGNOSIS — G629 Polyneuropathy, unspecified: Secondary | ICD-10-CM

## 2021-04-17 MED ORDER — ONDANSETRON 4 MG PO TBDP
4.0000 mg | ORAL_TABLET | Freq: Every evening | ORAL | 0 refills | Status: DC | PRN
  Filled 2021-04-17: qty 10, 10d supply, fill #0

## 2021-04-17 NOTE — ED Provider Notes (Signed)
[facility]    CSN: [id number] Arrival date & time: 04/17/21  1234      History   Chief Complaint Chief Complaint  Patient presents with   Foot Pain    HPI Shelley Bartlett is a 55 y.o. female.   55 year old female with history of DM comes in for chronic history of bilateral foot and hand numbness/tingling and burning sensation. HPI obtained by patient through video translator. Patient saw PCP 01/2021 and was started on gabapentin 100mg  QHS, patient took one dose, felt nauseated and did not take any more of the medicine. She denies any changes in current symptoms. Denies new injuries.   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 No pertinent surgical history.  OB History     Gravida  5   Para  5   Term  5   Preterm      AB      Living  5      SAB      IAB      Ectopic      Multiple      Live Births           Obstetric Comments  Youngest child born 2001          Home Medications    Prior to Admission medications   Medication Sig Start Date End Date Taking? Authorizing Provider  ondansetron (ZOFRAN ODT) 4 MG disintegrating tablet Take 1 tablet (4 mg total) by mouth at bedtime as needed for nausea or vomiting. 04/17/21  Yes Eyvonne Burchfield V, PA-C  Accu-Chek Softclix Lancets lancets 1 each by Other route in the morning, at noon, in the evening, and at bedtime. Use as instructed 08/28/20    Meccariello, 08/30/20, DO  Dulaglutide (TRULICITY) 1.5 MG/0.5ML SOPN Inject 1.5 mg into the skin once a week. 02/12/21 02/12/22  02/14/22, NP  gabapentin (NEURONTIN) 100 MG capsule Take 1 capsule (100 mg total) by mouth at bedtime for 14 days, THEN 1 capsule (100 mg total) at bedtime. 02/27/21 03/13/22  05/13/22, NP  glucose blood (ACCU-CHEK GUIDE) test strip Use four times daily to check blood sugars. 08/28/20   Meccariello, 08/30/20, DO    Family History Family History  Problem Relation Age of Onset   Healthy Mother    Healthy Father     Social History Social History   Tobacco Use   Smoking status: Never   Smokeless tobacco: Never  Vaping Use   Vaping Use: Never used  Substance Use Topics   Alcohol use: Never   Drug use: Never     Allergies   Patient has no known allergies.   Review of Systems Review of Systems  Reason unable to perform ROS: See HPI as above.    Physical Exam Triage Vital Signs ED Triage Vitals  Enc Vitals Group     BP 04/17/21 1312 138/77  Pulse Rate 04/17/21 1312 84     Resp 04/17/21 1312 20     Temp 04/17/21 1312 98.6 F (37 C)     Temp Source 04/17/21 1312 Oral     SpO2 04/17/21 1312 100 %     Weight --      Height --      Head Circumference --      Peak Flow --      Pain Score 04/17/21 1314 7     Pain Loc --      Pain Edu? --      Excl. in GC? --    No data found.  Updated Vital Signs BP 138/77 (BP Location: Right Arm)   Pulse 84   Temp 98.6 F (37 C) (Oral)   Resp 20   SpO2 100%     Physical Exam Constitutional:      General: She is not in acute distress.    Appearance: Normal appearance. She is well-developed. She is not toxic-appearing or diaphoretic.  HENT:     Head: Normocephalic and atraumatic.  Eyes:     Conjunctiva/sclera: Conjunctivae normal.     Pupils: Pupils are equal, round, and reactive to light.  Pulmonary:     Effort: Pulmonary effort is normal. No respiratory distress.  Musculoskeletal:      Cervical back: Normal range of motion and neck supple.     Comments: No swelling, erythema, warmth, contusion. No tenderness to BUE/BLE. FROM of BUE/BLE. Strength 5/5. Sensation 5/5 and equal bilaterally.  Radial, pedal pulse 2+.   Skin:    General: Skin is warm and dry.  Neurological:     Mental Status: She is alert and oriented to person, place, and time.     UC Treatments / Results  Labs (all labs ordered are listed, but only abnormal results are displayed) Labs Reviewed - No data to display  EKG   Radiology No results found.  Procedures Procedures (including critical care time)  Medications Ordered in UC Medications - No data to display  Initial Impression / Assessment and Plan / UC Course  I have reviewed the triage vital signs and the nursing notes.  Pertinent labs & imaging results that were available during my care of the patient were reviewed by me and considered in my medical decision making (see chart for details).    Discussed nausea should slowly resolve after taking gabapentin consistently. Will provide short course of zofran if needed. Encouraged daily bedtime dose as directed by PCP with follow up for titration if tolerating. Return precautions given.   Final Clinical Impressions(s) / UC Diagnoses   Final diagnoses:  Neuropathy     ED Prescriptions     Medication Sig Dispense Auth. Provider   ondansetron (ZOFRAN ODT) 4 MG disintegrating tablet Take 1 tablet (4 mg total) by mouth at bedtime as needed for nausea or vomiting. 10 tablet Belinda Fisher, PA-C      I have reviewed the PDMP during this encounter.   Belinda Fisher, PA-C 04/17/21 1438

## 2021-04-17 NOTE — Discharge Instructions (Addendum)
Restart gabapentin 100mg  at night You can use zofran if needed for nausea at the beginning.   The nausea/head foggy feeling should resolve after a few days of gabapentin. Follow up with PCP for further evaluation needed.

## 2021-04-17 NOTE — ED Triage Notes (Signed)
Pt reports bilateral foot pain and numbness, bilateral hands numbness  x 1 year. States she is having trouble sleeping due to pain. Reports is diabetic.

## 2021-05-07 ENCOUNTER — Other Ambulatory Visit: Payer: Self-pay

## 2021-05-14 ENCOUNTER — Ambulatory Visit (INDEPENDENT_AMBULATORY_CARE_PROVIDER_SITE_OTHER): Payer: Self-pay | Admitting: Nurse Practitioner

## 2021-05-14 ENCOUNTER — Other Ambulatory Visit: Payer: Self-pay

## 2021-05-14 ENCOUNTER — Encounter: Payer: Self-pay | Admitting: Nurse Practitioner

## 2021-05-14 ENCOUNTER — Ambulatory Visit: Payer: Self-pay | Admitting: Nurse Practitioner

## 2021-05-14 VITALS — BP 132/90 | HR 92 | Temp 97.3°F | Ht 59.5 in | Wt 135.0 lb

## 2021-05-14 DIAGNOSIS — E1140 Type 2 diabetes mellitus with diabetic neuropathy, unspecified: Secondary | ICD-10-CM

## 2021-05-14 DIAGNOSIS — R82998 Other abnormal findings in urine: Secondary | ICD-10-CM

## 2021-05-14 DIAGNOSIS — G4700 Insomnia, unspecified: Secondary | ICD-10-CM

## 2021-05-14 DIAGNOSIS — M65322 Trigger finger, left index finger: Secondary | ICD-10-CM

## 2021-05-14 LAB — POCT URINALYSIS DIPSTICK
Bilirubin, UA: NEGATIVE
Blood, UA: NEGATIVE
Glucose, UA: NEGATIVE
Ketones, UA: NEGATIVE
Nitrite, UA: NEGATIVE
Protein, UA: NEGATIVE
Spec Grav, UA: 1.025 (ref 1.010–1.025)
Urobilinogen, UA: 0.2 E.U./dL
pH, UA: 5.5 (ref 5.0–8.0)

## 2021-05-14 LAB — POCT GLYCOSYLATED HEMOGLOBIN (HGB A1C)
HbA1c POC (<> result, manual entry): 7.2 % (ref 4.0–5.6)
HbA1c, POC (controlled diabetic range): 7.2 % — AB (ref 0.0–7.0)
HbA1c, POC (prediabetic range): 7.2 % — AB (ref 5.7–6.4)
Hemoglobin A1C: 7.2 % — AB (ref 4.0–5.6)

## 2021-05-14 LAB — GLUCOSE, POCT (MANUAL RESULT ENTRY): POC Glucose: 159 mg/dl — AB (ref 70–99)

## 2021-05-14 MED ORDER — DICLOFENAC SODIUM 1 % EX GEL
4.0000 g | Freq: Four times a day (QID) | CUTANEOUS | 2 refills | Status: AC
  Filled 2021-05-14: qty 100, 7d supply, fill #0
  Filled 2021-08-25: qty 100, 7d supply, fill #1

## 2021-05-14 MED ORDER — AMITRIPTYLINE HCL 75 MG PO TABS
ORAL_TABLET | ORAL | 0 refills | Status: DC
  Filled 2021-05-14: qty 53, 30d supply, fill #0
  Filled 2021-05-15: qty 60, 30d supply, fill #0

## 2021-05-14 NOTE — Patient Instructions (Addendum)
Neuropathic Pain Neuropathic pain is pain caused by damage to the nerves that are responsible for certain sensations in your body (sensory nerves). The pain can be caused by: Damage to the sensory nerves that send signals to your spinal cord and brain (peripheral nervous system). Damage to the sensory nerves in your brain or spinal cord (central nervous system). Neuropathic pain can make you more sensitive to pain. Even a minor sensation can feel very painful. This is usually a long-term condition that can be difficult to treat. The type of pain differs from person to person. It may: Start suddenly (acute), or it may develop slowly and last for a long time (chronic). Come and go as damaged nerves heal, or it may stay at the same level for years. Cause emotional distress, loss of sleep, and a lower quality of life. What are the causes? The most common cause of this condition is diabetes. Many other diseases and conditions can also cause neuropathic pain. Causes of neuropathic pain can be classified as: Toxic. This is caused by medicines and chemicals. The most common cause of toxic neuropathic pain is damage from cancer treatments (chemotherapy). Metabolic. This can be caused by: Diabetes. This is the most common disease that damages the nerves. Lack of vitamin B from long-term alcohol abuse. Traumatic. Any injury that cuts, crushes, or stretches a nerve can cause damage and pain. A common example is feeling pain after losing an arm or leg (phantom limb pain). Compression-related. If a sensory nerve gets trapped or compressed for a long period of time, the blood supply to the nerve can be cut off. Vascular. Many blood vessel diseases can cause neuropathic pain by decreasing blood supply and oxygen to nerves. Autoimmune. This type of pain results from diseases in which the body's defense system (immune system) mistakenly attacks sensory nerves. Examples of autoimmune diseases that can cause  neuropathic pain include lupus and multiple sclerosis. Infectious. Many types of viral infections can damage sensory nerves and cause pain. Shingles infection is a common cause of this type of pain. Inherited. Neuropathic pain can be a symptom of many diseases that are passed down through families (genetic). What increases the risk? You are more likely to develop this condition if: You have diabetes. You smoke. You drink too much alcohol. You are taking certain medicines, including medicines that kill cancer cells (chemotherapy) or that treat immune system disorders. What are the signs or symptoms? The main symptom is pain. Neuropathic pain is often described as: Burning. Shock-like. Stinging. Hot or cold. Itching. How is this diagnosed? No single test can diagnose neuropathic pain. It is diagnosed based on: Physical exam and your symptoms. Your health care provider will ask you about your pain. You may be asked to use a pain scale to describe how bad your pain is. Tests. These may be done to see if you have a high sensitivity to pain and to help find the cause and location of any sensory nerve damage. They include: Nerve conduction studies to test how well nerve signals travel through your sensory nerves (electrodiagnostic testing). Stimulating your sensory nerves through electrodes on your skin and measuring the response in your spinal cord and brain (somatosensory evoked potential). Imaging studies, such as: X-rays. CT scan. MRI. How is this treated? Treatment for neuropathic pain may change over time. You may need to try different treatment options or a combination of treatments. Some options include: Treating the underlying cause of the neuropathy, such as diabetes, kidney disease, or vitamin   deficiencies. Stopping medicines that can cause neuropathy, such as chemotherapy. Medicine to relieve pain. Medicines may include: Prescription or over-the-counter pain  medicine. Anti-seizure medicine. Antidepressant medicines. Pain-relieving patches that are applied to painful areas of skin. A medicine to numb the area (local anesthetic), which can be injected as a nerve block. Transcutaneous nerve stimulation. This uses electrical currents to block painful nerve signals. The treatment is painless. Alternative treatments, such as: Acupuncture. Meditation. Massage. Physical therapy. Pain management programs. Counseling. Follow these instructions at home: Medicines  Take over-the-counter and prescription medicines only as told by your health care provider. Do not drive or use heavy machinery while taking prescription pain medicine. If you are taking prescription pain medicine, take actions to prevent or treat constipation. Your health care provider may recommend that you: Drink enough fluid to keep your urine pale yellow. Eat foods that are high in fiber, such as fresh fruits and vegetables, whole grains, and beans. Limit foods that are high in fat and processed sugars, such as fried or sweet foods. Take an over-the-counter or prescription medicine for constipation.  Lifestyle  Have a good support system at home. Consider joining a chronic pain support group. Do not use any products that contain nicotine or tobacco, such as cigarettes and e-cigarettes. If you need help quitting, ask your health care provider. Do not drink alcohol.  General instructions Learn as much as you can about your condition. Work closely with all your health care providers to find the treatment plan that works best for you. Ask your health care provider what activities are safe for you. Keep all follow-up visits as told by your health care provider. This is important. Contact a health care provider if: Your pain treatments are not working. You are having side effects from your medicines. You are struggling with tiredness (fatigue), mood changes, depression, or  anxiety. Summary Neuropathic pain is pain caused by damage to the nerves that are responsible for certain sensations in your body (sensory nerves). Neuropathic pain may come and go as damaged nerves heal, or it may stay at the same level for years. Neuropathic pain is usually a long-term condition that can be difficult to treat. Consider joining a chronic pain support group. This information is not intended to replace advice given to you by your health care provider. Make sure you discuss any questions you have with your healthcare provider. Document Revised: 02/09/2019 Document Reviewed: 11/05/2017 Elsevier Patient Education  2022 [employer] Inc[employer]  Insomnia Insomnia is a sleep disorder that makes it difficult to fall asleep or stay asleep. Insomnia can cause fatigue, low energy, difficulty concentrating, moodswings, and poor performance at work or school. There are three different ways to classify insomnia: Difficulty falling asleep. Difficulty staying asleep. Waking up too early in the morning. Any type of insomnia can be long-term (chronic) or short-term (acute). Both are common. Short-term insomnia usually lasts for three months or less. Chronic insomnia occurs at least three times a week for longer than threemonths. What are the causes? Insomnia may be caused by another condition, situation, or substance, such as: Anxiety. Certain medicines. Gastroesophageal reflux disease (GERD) or other gastrointestinal conditions. Asthma or other breathing conditions. Restless legs syndrome, sleep apnea, or other sleep disorders. Chronic pain. Menopause. Stroke. Abuse of alcohol, tobacco, or illegal drugs. Mental health conditions, such as depression. Caffeine. Neurological disorders, such as Alzheimer's disease. An overactive thyroid (hyperthyroidism). Sometimes, the cause of insomnia may not be known. What increases the risk? Risk factors for insomnia include: Gender.  Women are affected more  often than men. Age. Insomnia is more common as you get older. Stress. Lack of exercise. Irregular work schedule or working night shifts. Traveling between different time zones. Certain medical and mental health conditions. What are the signs or symptoms? If you have insomnia, the main symptom is having trouble falling asleep or having trouble staying asleep. This may lead to other symptoms, such as: Feeling fatigued or having low energy. Feeling nervous about going to sleep. Not feeling rested in the morning. Having trouble concentrating. Feeling irritable, anxious, or depressed. How is this diagnosed? This condition may be diagnosed based on: Your symptoms and medical history. Your health care provider may ask about: Your sleep habits. Any medical conditions you have. Your mental health. A physical exam. How is this treated? Treatment for insomnia depends on the cause. Treatment may focus on treating an underlying condition that is causing insomnia. Treatment may also include: Medicines to help you sleep. Counseling or therapy. Lifestyle adjustments to help you sleep better. Follow these instructions at home: Eating and drinking  Limit or avoid alcohol, caffeinated beverages, and cigarettes, especially close to bedtime. These can disrupt your sleep. Do not eat a large meal or eat spicy foods right before bedtime. This can lead to digestive discomfort that can make it hard for you to sleep.  Sleep habits  Keep a sleep diary to help you and your health care provider figure out what could be causing your insomnia. Write down: When you sleep. When you wake up during the night. How well you sleep. How rested you feel the next day. Any side effects of medicines you are taking. What you eat and drink. Make your bedroom a dark, comfortable place where it is easy to fall asleep. Put up shades or blackout curtains to block light from outside. Use a white noise machine to block  noise. Keep the temperature cool. Limit screen use before bedtime. This includes: Watching TV. Using your smartphone, tablet, or computer. Stick to a routine that includes going to bed and waking up at the same times every day and night. This can help you fall asleep faster. Consider making a quiet activity, such as reading, part of your nighttime routine. Try to avoid taking naps during the day so that you sleep better at night. Get out of bed if you are still awake after 15 minutes of trying to sleep. Keep the lights down, but try reading or doing a quiet activity. When you feel sleepy, go back to bed.  General instructions Take over-the-counter and prescription medicines only as told by your health care provider. Exercise regularly, as told by your health care provider. Avoid exercise starting several hours before bedtime. Use relaxation techniques to manage stress. Ask your health care provider to suggest some techniques that may work well for you. These may include: Breathing exercises. Routines to release muscle tension. Visualizing peaceful scenes. Make sure that you drive carefully. Avoid driving if you feel very sleepy. Keep all follow-up visits as told by your health care provider. This is important. Contact a health care provider if: You are tired throughout the day. You have trouble in your daily routine due to sleepiness. You continue to have sleep problems, or your sleep problems get worse. Get help right away if: You have serious thoughts about hurting yourself or someone else. If you ever feel like you may hurt yourself or others, or have thoughts about taking your own life, get help right away. You can  go to your nearest emergency department or call: Your local emergency services (911 in the [place] A suicide crisis helpline, such as the National Suicide Prevention Lifeline at 617-403-4845. This is open 24 hours a day. Summary Insomnia is a sleep disorder that makes it  difficult to fall asleep or stay asleep. Insomnia can be long-term (chronic) or short-term (acute). Treatment for insomnia depends on the cause. Treatment may focus on treating an underlying condition that is causing insomnia. Keep a sleep diary to help you and your health care provider figure out what could be causing your insomnia. This information is not intended to replace advice given to you by your health care provider. Make sure you discuss any questions you have with your healthcare provider. Document Revised: 08/29/2020 Document Reviewed: 08/29/2020 Elsevier Patient Education  2022 [employer] Inc.  Trigger Finger  Trigger finger, also called stenosing tenosynovitis,  is a condition that causes a finger to get stuck in a bent position. Each finger has a tendon, which is a tough, cord-like tissue that connects muscle tobone, and each tendon passes through a tunnel of tissue called a tendon sheath. To move your finger, your tendon needs to glide freely through the sheath. Trigger finger happens when the tendon or the sheath thickens, making it difficult to move your finger. Trigger finger can affect any finger or a thumb. It may affect more than one finger. Mild cases may clear up with rest andmedicine. Severe cases require more treatment. What are the causes? Trigger finger is caused by a thickened finger tendon or tendon sheath. Thecause of this thickening is not known. What increases the risk? The following factors may make you more likely to develop this condition: Doing activities that require a strong grip. Having rheumatoid arthritis, gout, or diabetes. Being 77-16 years old. Being female. What are the signs or symptoms? Symptoms of this condition include: Pain when bending or straightening your finger. Tenderness or swelling where your finger attaches to the palm of your hand. A lump in the palm of your hand or on the inside of your finger. Hearing a noise like a pop or a snap  when you try to straighten your finger. Feeling a catching or locking sensation when you try to straighten your finger. Being unable to straighten your finger. How is this diagnosed? This condition is diagnosed based on your symptoms and a physical exam. How is this treated? This condition may be treated by: Resting your finger and avoiding activities that make symptoms worse. Wearing a finger splint to keep your finger extended. Taking NSAIDs, such as ibuprofen, to relieve pain and swelling. Doing gentle exercises to stretch the finger as told by your health care provider. Having medicine that reduces swelling and inflammation (steroids) injected into the tendon sheath. Injections may need to be repeated. Having surgery to open the tendon sheath. This may be done if other treatments do not work and you cannot straighten your finger. You may need physical therapy after surgery. Follow these instructions at home: If you have a splint: Wear the splint as told by your health care provider. Remove it only as told by your health care provider. Loosen it if your fingers tingle, become numb, or turn cold and blue. Keep it clean. If the splint is not waterproof: Do not let it get wet. Cover it with a watertight covering when you take a bath or shower. Managing pain, stiffness, and swelling     If directed, apply heat to the affected area as  often as told by your health care provider. Use the heat source that your health care provider recommends, such as a moist heat pack or a heating pad. Place a towel between your skin and the heat source. Leave the heat on for 20-30 minutes. Remove the heat if your skin turns bright red. This is especially important if you are unable to feel pain, heat, or cold. You may have a greater risk of getting burned. If directed, put ice on the painful area. To do this: If you have a removable splint, remove it as told by your health care provider. Put ice in a  plastic bag. Place a towel between your skin and the bag or between your splint and the bag. Leave the ice on for 20 minutes, 2-3 times a day.  Activity Rest your finger as told by your health care provider. Avoid activities that make the pain worse. Return to your normal activities as told by your health care provider. Ask your health care provider what activities are safe for you. Do exercises as told by your health care provider. Ask your health care provider when it is safe to drive if you have a splint on your hand. General instructions Take over-the-counter and prescription medicines only as told by your health care provider. Keep all follow-up visits as told by your health care provider. This is important. Contact a health care provider if: Your symptoms are not improving with home care. Summary Trigger finger, also called stenosing tenosynovitis, causes your finger to get stuck in a bent position. This can make it difficult and painful to straighten your finger. This condition develops when a finger tendon or tendon sheath thickens. Treatment may include resting your finger, wearing a splint, and taking medicines. In severe cases, surgery to open the tendon sheath may be needed. This information is not intended to replace advice given to you by your health care provider. Make sure you discuss any questions you have with your healthcare provider. Document Revised: 03/06/2019 Document Reviewed: 03/06/2019 Elsevier Patient Education  2022 [employer]

## 2021-05-14 NOTE — Progress Notes (Signed)
[address] [address], [place]  [address] Phone:  [phone]   Fax:  [phone]   Established Patient Office Visit  Subjective:  Patient ID: Shelley Bartlett, female    DOB: 05-25-1966  Age: 55 y.o. MRN: [id number]  CC:  Chief Complaint  Patient presents with   Follow-up    2 month follow up    HPI Shakeda Pearse presents for follow up. She  has a past medical history of Diabetes ([place]) and Goiter.   She is in today with her daughter for her follow-up for diabetes.  She is currently on Trulicity 1.5 mg weekly.  She continues to have numbness tingling and burning in her feet.  She also reports pain in her hands. Denies headache, dizziness, visual changes, shortness of breath, dyspnea on exertion, chest pain, nausea, vomiting or any edema.  She is compliant with her medication and reports that her CBG is within normal range.  Past Medical History:  Diagnosis Date   Diabetes ([place])    Goiter     No past surgical history on file.  Family History  Problem Relation Age of Onset   Healthy Mother    Healthy Father     Social History   Socioeconomic History   Marital status: Married    Spouse name: [name]   Number of children: 5   Years of education: Not on file   Highest education level: Not on file  Occupational History   Not on file  Tobacco Use   Smoking status: Never   Smokeless tobacco: Never  Vaping Use   Vaping Use: Never used  Substance and Sexual Activity   Alcohol use: Never   Drug use: Never   Sexual activity: Not on file  Other Topics Concern   Not on file  Social History Narrative   Not on file   Social Determinants of Health   Financial Resource Strain: Not on file  Food Insecurity: Not on file  Transportation Needs: Not on file  Physical Activity: Not on file  Stress: Not on file  Social Connections: Not on file  Intimate Partner Violence: Not on file    Outpatient Medications Prior to Visit   Medication Sig Dispense Refill   Accu-Chek Softclix Lancets lancets 1 each by Other route in the morning, at noon, in the evening, and at bedtime. Use as instructed 100 each 12   Dulaglutide (TRULICITY) 1.5 EP/3.2RJ SOPN Inject 1.5 mg into the skin once a week. 6 mL 3   glucose blood (ACCU-CHEK GUIDE) test strip Use four times daily to check blood sugars. 100 each 12   gabapentin (NEURONTIN) 100 MG capsule Take 1 capsule (100 mg total) by mouth at bedtime for 14 days, THEN 1 capsule (100 mg total) at bedtime. (Patient not taking: Reported on 05/14/2021) 90 capsule 3   ondansetron (ZOFRAN ODT) 4 MG disintegrating tablet Take 1 tablet (4 mg total) by mouth at bedtime as needed for nausea or vomiting. 10 tablet 0   No facility-administered medications prior to visit.    No Known Allergies  ROS Review of Systems    Objective:    Physical Exam HENT:     Head: Normocephalic and atraumatic.     Nose: Nose normal.     Mouth/Throat:     Mouth: Mucous membranes are moist.  Cardiovascular:     Rate and Rhythm: Normal rate and regular rhythm.     Pulses: Normal pulses.  Heart sounds: Normal heart sounds.  Pulmonary:     Effort: Pulmonary effort is normal.     Breath sounds: Normal breath sounds.  Abdominal:     General: Bowel sounds are normal.     Palpations: Abdomen is soft.  Musculoskeletal:     Cervical back: Normal range of motion.     Comments: Trigger finger left hand index and thumb  Skin:    General: Skin is warm and dry.     Capillary Refill: Capillary refill takes less than 2 seconds.  Neurological:     General: No focal deficit present.     Mental Status: She is alert and oriented to person, place, and time.  Psychiatric:        Mood and Affect: Mood normal.        Behavior: Behavior normal.        Thought Content: Thought content normal.        Judgment: Judgment normal.    BP 132/90 (BP Location: Right Arm, Patient Position: Sitting)   Pulse 92   Temp (!)  97.3 F (36.3 C)   Ht 4' 11.5" (1.511 m)   Wt 135 lb 0.4 oz (61.2 kg)   SpO2 98%   BMI 26.82 kg/m  Wt Readings from Last 3 Encounters:  05/14/21 135 lb 0.4 oz (61.2 kg)  02/12/21 134 lb (60.8 kg)  01/15/21 131 lb (59.4 kg)     Health Maintenance Due  Topic Date Due   COVID-19 Vaccine (1) Never done   OPHTHALMOLOGY EXAM  Never done   URINE MICROALBUMIN  Never done   PAP SMEAR-Modifier  Never done   MAMMOGRAM  Never done   FOOT EXAM  05/14/2021    There are no preventive care reminders to display for this patient.  Lab Results  Component Value Date   TSH 0.913 05/07/2020   Lab Results  Component Value Date   WBC 4.4 10/04/2020   HGB 14.2 10/04/2020   HCT 43.2 10/04/2020   MCV 83.1 10/04/2020   PLT 154 10/04/2020   Lab Results  Component Value Date   NA 140 01/15/2021   K 4.1 01/15/2021   CO2 19 (L) 10/14/2020   GLUCOSE 279 (H) 01/15/2021   BUN 9 01/15/2021   CREATININE 0.61 01/15/2021   BILITOT 0.3 01/15/2021   ALKPHOS 77 01/15/2021   AST 13 01/15/2021   ALT 23 05/07/2020   PROT 6.5 01/15/2021   ALBUMIN 4.1 01/15/2021   CALCIUM 8.7 01/15/2021   ANIONGAP 12 10/04/2020   EGFR 107 01/15/2021   Lab Results  Component Value Date   CHOL 166 05/07/2020   Lab Results  Component Value Date   HDL 47 05/07/2020   Lab Results  Component Value Date   LDLCALC 103 (H) 05/07/2020   Lab Results  Component Value Date   TRIG 84 05/07/2020   Lab Results  Component Value Date   CHOLHDL 3.5 05/07/2020   Lab Results  Component Value Date   HGBA1C 7.2 (A) 05/14/2021   HGBA1C 7.2 05/14/2021   HGBA1C 7.2 (A) 05/14/2021   HGBA1C 7.2 (A) 05/14/2021      Assessment & Plan:   Problem List Items Addressed This Visit       Endocrine   Type 2 diabetes mellitus with diabetic neuropathy, without long-term current use of insulin (HCC) - Primary Diabetes stable neuropathy persistent unable to tolerate gabapentin seeking additional options Initiated amitriptyline  75 mg daily we will follow-up in 4 weeks for further  evaluation Nutrition consult ordered   Relevant Orders   Urinalysis Dipstick (Completed)   HgB A1c (Completed)   Glucose (CBG) (Completed)   Other Visit Diagnoses     Urine white blood cells increased       Relevant Orders   Urine Culture   Type 2 diabetes mellitus with diabetic polyneuropathy, without long-term current use of insulin (HCC)       Trigger finger index left hand Worsening Trial diclofenac 1% to 4 times daily    Meds ordered this encounter  Medications   amitriptyline (ELAVIL) 75 MG tablet    Sig: Take 1 tablet (75 mg total) by mouth at bedtime for 7 days, THEN 2 tablets (150 mg total) at bedtime.    Dispense:  727 tablet    Refill:  0    Order Specific Question:   Supervising Provider    Answer:   Tresa Garter [id number]   diclofenac Sodium (VOLTAREN) 1 % GEL    Sig: Apply 4 g topically 4 (four) times daily.    Dispense:  100 g    Refill:  2    Do not add this medication to the electronic "Automatic Refill" notification system. Patient may have prescription filled one day early if pharmacy is closed on scheduled refill date.    Order Specific Question:   Supervising Provider    Answer:   Tresa Garter [[id number]]     Follow-up: Return in about 2 weeks (around 05/28/2021) for Virtual visit , follow up medication management .    Vevelyn Francois, NP

## 2021-05-15 ENCOUNTER — Other Ambulatory Visit: Payer: Self-pay

## 2021-05-15 LAB — LIPID PANEL
Chol/HDL Ratio: 3.7 ratio (ref 0.0–4.4)
Cholesterol, Total: 173 mg/dL (ref 100–199)
HDL: 47 mg/dL (ref >39)
LDL Chol Calc (NIH): 111 mg/dL — ABNORMAL HIGH (ref 0–99)
Triglycerides: 79 mg/dL (ref 0–149)
VLDL Cholesterol Cal: 15 mg/dL (ref 5–40)

## 2021-05-15 LAB — COMP. METABOLIC PANEL (12)
AST: 20 IU/L (ref 0–40)
Albumin/Globulin Ratio: 1.9 (ref 1.2–2.2)
Albumin: 4.4 g/dL (ref 3.8–4.9)
Alkaline Phosphatase: 56 IU/L (ref 44–121)
BUN/Creatinine Ratio: 18 (ref 9–23)
BUN: 12 mg/dL (ref 6–24)
Bilirubin Total: 0.4 mg/dL (ref 0.0–1.2)
Calcium: 8.8 mg/dL (ref 8.7–10.2)
Chloride: 103 mmol/L (ref 96–106)
Creatinine, Ser: 0.67 mg/dL (ref 0.57–1.00)
Globulin, Total: 2.3 g/dL (ref 1.5–4.5)
Glucose: 152 mg/dL — ABNORMAL HIGH (ref 65–99)
Potassium: 4.1 mmol/L (ref 3.5–5.2)
Sodium: 139 mmol/L (ref 134–144)
Total Protein: 6.7 g/dL (ref 6.0–8.5)
eGFR: 104 mL/min/{1.73_m2} (ref >59)

## 2021-05-16 ENCOUNTER — Encounter: Payer: Self-pay | Admitting: Nurse Practitioner

## 2021-05-16 ENCOUNTER — Other Ambulatory Visit: Payer: Self-pay

## 2021-05-16 LAB — URINE CULTURE

## 2021-05-28 ENCOUNTER — Telehealth (INDEPENDENT_AMBULATORY_CARE_PROVIDER_SITE_OTHER): Payer: Self-pay | Admitting: Nurse Practitioner

## 2021-05-28 ENCOUNTER — Other Ambulatory Visit: Payer: Self-pay

## 2021-05-28 ENCOUNTER — Encounter: Payer: Self-pay | Admitting: Nurse Practitioner

## 2021-05-28 DIAGNOSIS — G5690 Unspecified mononeuropathy of unspecified upper limb: Secondary | ICD-10-CM

## 2021-05-28 DIAGNOSIS — Z91199 Patient's noncompliance with other medical treatment and regimen due to unspecified reason: Secondary | ICD-10-CM

## 2021-05-28 DIAGNOSIS — Z5329 Procedure and treatment not carried out because of patient's decision for other reasons: Secondary | ICD-10-CM

## 2021-05-28 DIAGNOSIS — E1140 Type 2 diabetes mellitus with diabetic neuropathy, unspecified: Secondary | ICD-10-CM

## 2021-05-28 NOTE — Progress Notes (Addendum)
   [name] [name] [place], [place]  [address] Phone:  [phone]   Fax:  [phone] Virtual Visit via Telephone Note  I was unable to connect with [name] on 05/28/21 at  3:00 PM EDT by telephone   Barbette Merino, NP   Virtual Visit via Telephone Note  She later called back, I connected with Lacara Dunsworth and her daughter on 05/28/21 at  by telephone and verified that I am speaking with the correct person using two identifiers.   I discussed the limitations, risks, security and privacy concerns of performing an evaluation and management service by telephone and the availability of in person appointments. I also discussed with the patient that there may be a patient responsible charge related to this service. The patient expressed understanding and agreed to proceed.  Patient home Provider Office  History of Present Illness:  Shelley Bartlett  has a past medical history of Diabetes (HCC) and Goiter.  Her daughter is speaking on her behalf.  She feels a little better with the Amitriptyline however a little sleepy. She does report some improvement with the neuropathy.    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   Follow Up Instructions: Follow up scheduled   I discussed the assessment and treatment plan with the patient. The patient was provided an opportunity to ask questions and all were answered. The patient agreed with the plan and demonstrated an understanding of the instructions.   The patient was advised to call back or seek an in-person evaluation if the symptoms worsen or if the condition fails to improve as anticipated.  I provided 8 minutes of telephone- visit time during this encounter.   Barbette Merino, NP

## 2021-05-28 NOTE — Addendum Note (Signed)
Addended by: Barbette Merino on: 05/28/2021 04:50 PM   Modules accepted: Orders

## 2021-06-01 MED ORDER — AMITRIPTYLINE HCL 75 MG PO TABS: ORAL_TABLET | ORAL | 0 refills | Status: DC

## 2021-06-01 NOTE — Addendum Note (Signed)
Addended by: Barbette Merino on: 06/01/2021 08:45 AM   Modules accepted: Orders, Level of Service

## 2021-06-02 ENCOUNTER — Other Ambulatory Visit: Payer: Self-pay | Admitting: Nurse Practitioner

## 2021-06-02 IMAGING — CR DG CHEST 1V
1 series · 1 of 1 positions shown · non-contrast
Comparison: None.

CLINICAL DATA: Positive PPD

EXAM:
CHEST  1 VIEW

[w chest pa]
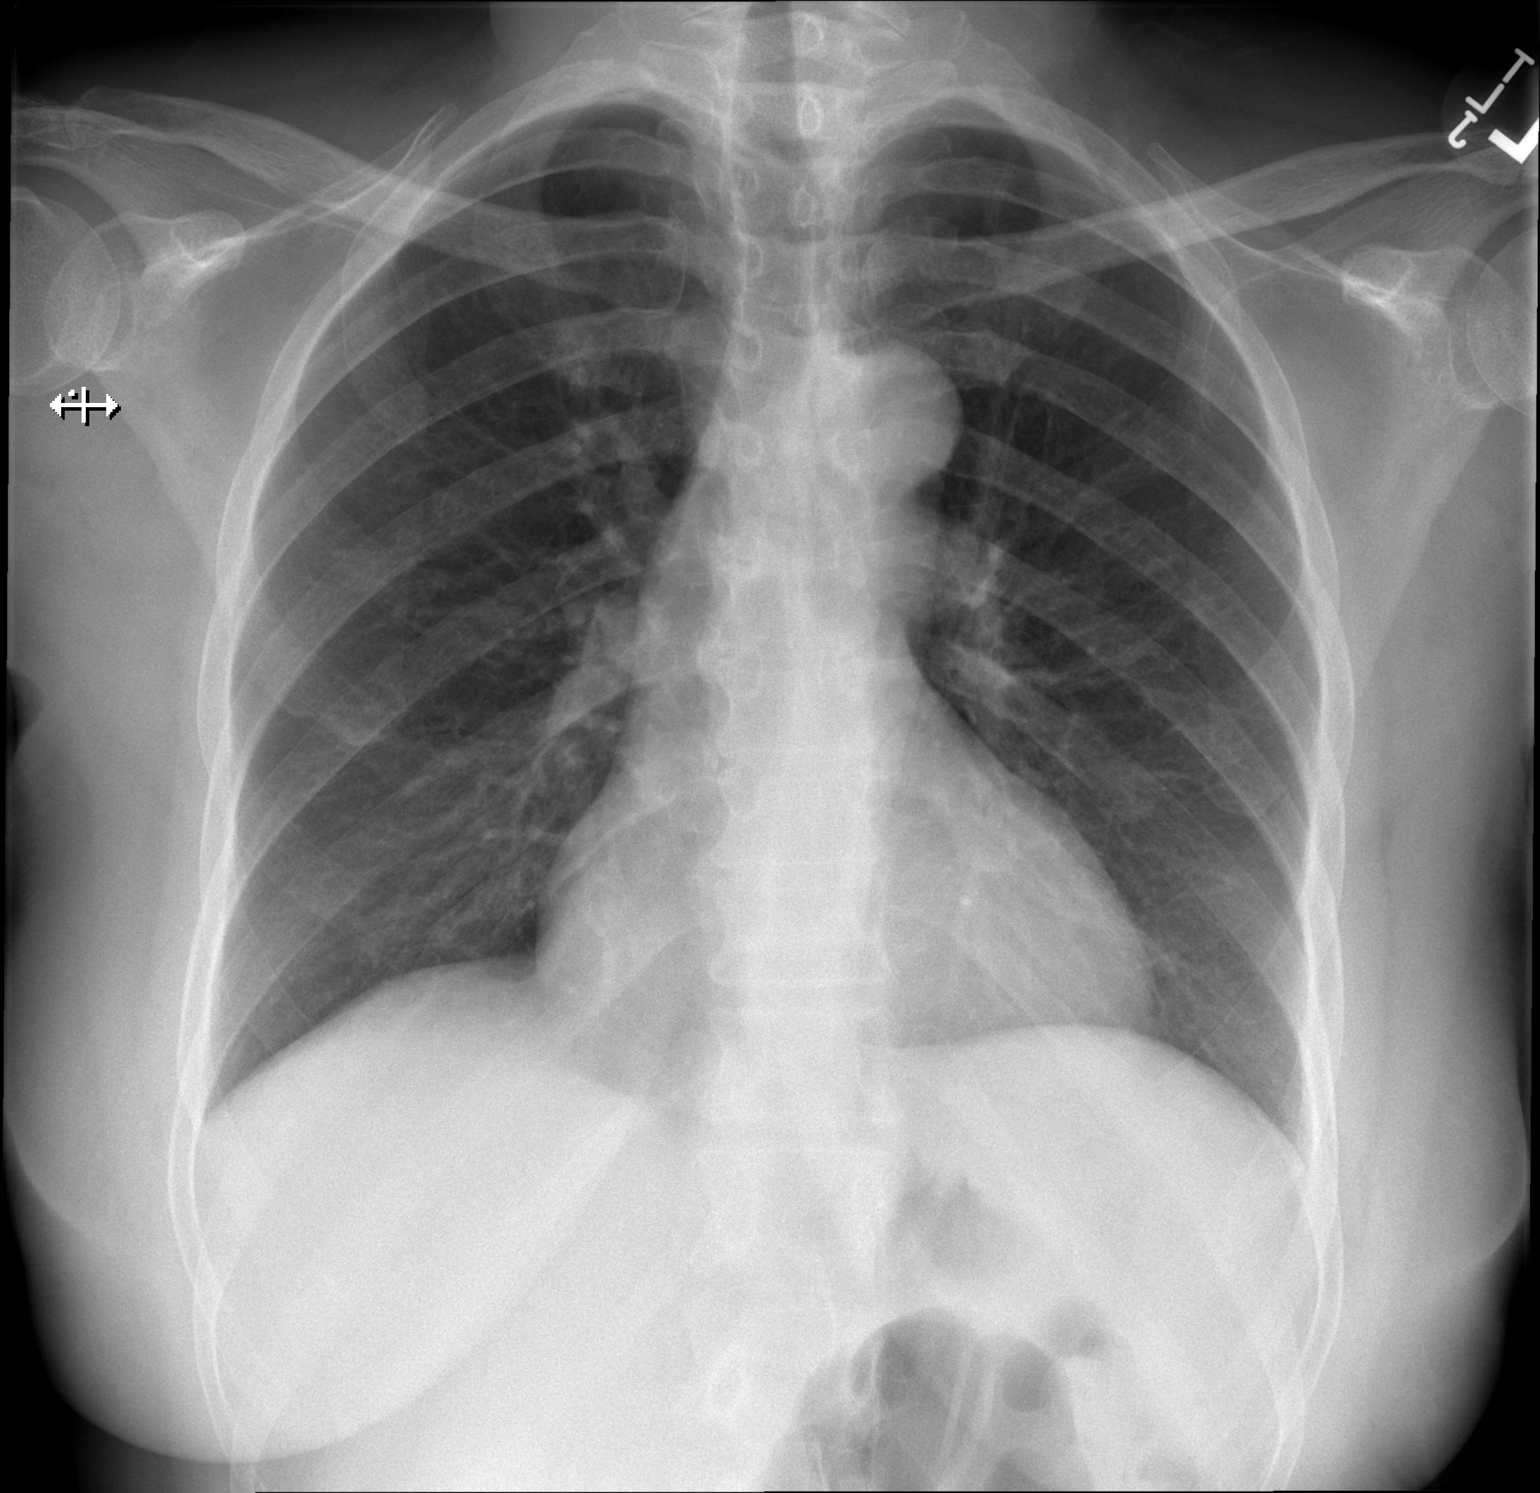

[1 of 1 positions shown; findings below may reference images not displayed]

FINDINGS: The heart size and mediastinal contours are within normal limits.
Both lungs are clear. The visualized skeletal structures are
unremarkable.
IMPRESSION: No active disease.

## 2021-06-02 MED ORDER — AMITRIPTYLINE HCL 75 MG PO TABS: 37.5000 mg | ORAL_TABLET | Freq: Every day | ORAL | 11 refills | Status: DC

## 2021-06-03 ENCOUNTER — Other Ambulatory Visit: Payer: Self-pay

## 2021-07-02 ENCOUNTER — Other Ambulatory Visit: Payer: Self-pay

## 2021-07-03 ENCOUNTER — Other Ambulatory Visit: Payer: Self-pay

## 2021-07-14 ENCOUNTER — Encounter: Payer: Self-pay | Admitting: Dietician

## 2021-07-17 ENCOUNTER — Ambulatory Visit: Payer: Self-pay | Admitting: Nurse Practitioner

## 2021-07-31 ENCOUNTER — Other Ambulatory Visit: Payer: Self-pay

## 2021-08-21 ENCOUNTER — Other Ambulatory Visit: Payer: Self-pay

## 2021-08-25 ENCOUNTER — Other Ambulatory Visit: Payer: Self-pay | Admitting: Nurse Practitioner

## 2021-08-25 ENCOUNTER — Other Ambulatory Visit: Payer: Self-pay

## 2021-08-26 ENCOUNTER — Other Ambulatory Visit: Payer: Self-pay

## 2021-08-26 ENCOUNTER — Telehealth: Payer: Self-pay

## 2021-08-26 NOTE — Telephone Encounter (Signed)
Pt is asking for refill Amitriptyline   [facility] in [facility] system!

## 2021-08-27 ENCOUNTER — Other Ambulatory Visit: Payer: Self-pay | Admitting: Nurse Practitioner

## 2021-08-27 MED ORDER — AMITRIPTYLINE HCL 75 MG PO TABS: 37.5000 mg | ORAL_TABLET | Freq: Every day | ORAL | 11 refills | Status: DC

## 2021-08-27 MED ORDER — AMITRIPTYLINE HCL 75 MG PO TABS
ORAL_TABLET | ORAL | 0 refills | Status: DC
  Filled 2021-08-27 – 2021-11-25 (×2): qty 60, 30d supply, fill #0
  Filled 2022-01-14: qty 60, 30d supply, fill #1

## 2021-08-28 ENCOUNTER — Other Ambulatory Visit: Payer: Self-pay

## 2021-08-29 ENCOUNTER — Other Ambulatory Visit: Payer: Self-pay

## 2021-09-01 ENCOUNTER — Other Ambulatory Visit: Payer: Self-pay

## 2021-09-02 ENCOUNTER — Other Ambulatory Visit: Payer: Self-pay

## 2021-09-08 ENCOUNTER — Other Ambulatory Visit: Payer: Self-pay

## 2021-09-08 ENCOUNTER — Ambulatory Visit (INDEPENDENT_AMBULATORY_CARE_PROVIDER_SITE_OTHER): Payer: Self-pay | Admitting: Nurse Practitioner

## 2021-09-08 ENCOUNTER — Encounter: Payer: Self-pay | Admitting: Nurse Practitioner

## 2021-09-08 VITALS — BP 136/76 | HR 95 | Temp 97.5°F | Ht 59.5 in | Wt 135.6 lb

## 2021-09-08 DIAGNOSIS — M79641 Pain in right hand: Secondary | ICD-10-CM

## 2021-09-08 DIAGNOSIS — M79642 Pain in left hand: Secondary | ICD-10-CM

## 2021-09-08 DIAGNOSIS — E119 Type 2 diabetes mellitus without complications: Secondary | ICD-10-CM

## 2021-09-08 DIAGNOSIS — E1140 Type 2 diabetes mellitus with diabetic neuropathy, unspecified: Secondary | ICD-10-CM

## 2021-09-08 DIAGNOSIS — Z23 Encounter for immunization: Secondary | ICD-10-CM

## 2021-09-08 LAB — POCT GLYCOSYLATED HEMOGLOBIN (HGB A1C)
HbA1c POC (<> result, manual entry): 7.6 % (ref 4.0–5.6)
HbA1c, POC (controlled diabetic range): 7.6 % — AB (ref 0.0–7.0)
HbA1c, POC (prediabetic range): 7.6 % — AB (ref 5.7–6.4)
Hemoglobin A1C: 7.6 % — AB (ref 4.0–5.6)

## 2021-09-08 MED ORDER — DICLOFENAC SODIUM 2 % EX SOLN
1.0000 "application " | Freq: Four times a day (QID) | CUTANEOUS | 5 refills | Status: DC
  Filled 2021-09-08: qty 112, fill #0

## 2021-09-08 NOTE — Progress Notes (Signed)
Shelley Bartlett, [place]  [address] Phone:  [phone]   Fax:  [phone]   Established Patient Office Visit  Subjective:  Patient ID: Shelley Bartlett, female    DOB: [birth year]  Age: 55 y.o. MRN: [id number]  CC:  Chief Complaint  Patient presents with   Follow-up    Pt is here today for her follow up visit.    HPI Shelley IOEVOJJK Bartlett presents for follow up. She  has a past medical history of Diabetes ([name]) and Goiter.   She is in today with her daughter. She reports that her Cbg 180-220. She is doing well overall  with her current regimen. She continues to have hands pain. She feels like the hand pain has gotten worse over the last month. She has swelling. She had a previous complaint of trigger finger. She declined ortho referral.  Diclofenac 1% QID was not effective. She does not tolerate oral medication very well.  Denies headache, dizziness, visual changes, shortness of breath, dyspnea on exertion, chest pain, nausea, vomiting or any edema.   Past Medical History:  Diagnosis Date   Diabetes ([name])    Goiter     History reviewed. No pertinent surgical history.  Family History  Problem Relation Age of Onset   Healthy Mother    Healthy Father     Social History   Socioeconomic History   Marital status: Married    Spouse name: [name]   Number of children: 5   Years of education: Not on file   Highest education level: Not on file  Occupational History   Not on file  Tobacco Use   Smoking status: Never   Smokeless tobacco: Never  Vaping Use   Vaping Use: Never used  Substance and Sexual Activity   Alcohol use: Never   Drug use: Never   Sexual activity: Yes    Birth control/protection: None  Other Topics Concern   Not on file  Social History Narrative   Not on file   Social Determinants of Health   Financial Resource Strain: Not on file  Food Insecurity: Not on file  Transportation Needs: Not on file   Physical Activity: Not on file  Stress: Not on file  Social Connections: Not on file  Intimate Partner Violence: Not on file    Outpatient Medications Prior to Visit  Medication Sig Dispense Refill   Accu-Chek Softclix Lancets lancets 1 each by Other route in the morning, at noon, in the evening, and at bedtime. Use as instructed 100 each 12   amitriptyline (ELAVIL) 75 MG tablet Take 1 tablet (75 mg total) by mouth at bedtime for 7 days, THEN 2 tablets (150 mg total) at bedtime. 727 tablet 0   Dulaglutide (TRULICITY) 1.5 KX/3.8HW SOPN Inject 1.5 mg into the skin once a week. 6 mL 3   glucose blood (ACCU-CHEK GUIDE) test strip Use four times daily to check blood sugars. 100 each 12   No facility-administered medications prior to visit.    No Known Allergies  ROS Review of Systems    Objective:    Physical Exam HENT:     Head: Normocephalic and atraumatic.     Nose: Nose normal.     Mouth/Throat:     Mouth: Mucous membranes are moist.  Cardiovascular:     Rate and Rhythm: Normal rate and regular rhythm.     Pulses: Normal pulses.     Heart sounds: Normal heart sounds.  Pulmonary:     Effort: Pulmonary effort is normal.     Breath sounds: Normal breath sounds.  Abdominal:     General: Bowel sounds are normal.     Palpations: Abdomen is soft.  Musculoskeletal:     Cervical back: Normal range of motion.  Skin:    General: Skin is warm and dry.     Capillary Refill: Capillary refill takes less than 2 seconds.  Neurological:     General: No focal deficit present.     Mental Status: She is alert and oriented to person, place, and time.  Psychiatric:        Mood and Affect: Mood normal.        Behavior: Behavior normal.        Thought Content: Thought content normal.        Judgment: Judgment normal.    BP 136/76   Pulse 95   Temp (!) 97.5 F (36.4 C)   Ht 4' 11.5" (1.511 m)   Wt 135 lb 9.6 oz (61.5 kg)   SpO2 98%   BMI 26.93 kg/m  Wt Readings from Last 3  Encounters:  09/08/21 135 lb 9.6 oz (61.5 kg)  05/14/21 135 lb 0.4 oz (61.2 kg)  02/12/21 134 lb (60.8 kg)     Health Maintenance Due  Topic Date Due   COVID-19 Vaccine (1) Never done   Pneumococcal Vaccine 76-4 Years old (1 - PCV) Never done   OPHTHALMOLOGY EXAM  Never done   URINE MICROALBUMIN  Never done   PAP SMEAR-Modifier  Never done   MAMMOGRAM  Never done   FOOT EXAM  05/14/2021    There are no preventive care reminders to display for this patient.  Lab Results  Component Value Date   TSH 0.913 05/07/2020   Lab Results  Component Value Date   WBC 4.4 10/04/2020   HGB 14.2 10/04/2020   HCT 43.2 10/04/2020   MCV 83.1 10/04/2020   PLT 154 10/04/2020   Lab Results  Component Value Date   NA 139 05/14/2021   K 4.1 05/14/2021   CO2 19 (L) 10/14/2020   GLUCOSE 152 (H) 05/14/2021   BUN 12 05/14/2021   CREATININE 0.67 05/14/2021   BILITOT 0.4 05/14/2021   ALKPHOS 56 05/14/2021   AST 20 05/14/2021   ALT 23 05/07/2020   PROT 6.7 05/14/2021   ALBUMIN 4.4 05/14/2021   CALCIUM 8.8 05/14/2021   ANIONGAP 12 10/04/2020   EGFR 104 05/14/2021   Lab Results  Component Value Date   CHOL 173 05/14/2021   Lab Results  Component Value Date   HDL 47 05/14/2021   Lab Results  Component Value Date   LDLCALC 111 (H) 05/14/2021   Lab Results  Component Value Date   TRIG 79 05/14/2021   Lab Results  Component Value Date   CHOLHDL 3.7 05/14/2021   Lab Results  Component Value Date   HGBA1C 7.6 (A) 09/08/2021   HGBA1C 7.6 09/08/2021   HGBA1C 7.6 (A) 09/08/2021   HGBA1C 7.6 (A) 09/08/2021      Assessment & Plan:   Problem List Items Addressed This Visit       Endocrine   Type 2 diabetes mellitus with diabetic neuropathy, without long-term current use of insulin (HCC) - Primary Stable  Encourage compliance with current treatment regimen   Encourage regular CBG monitoring Encourage contacting office if excessive hyperglycemia and or  hypoglycemia Lifestyle modification with healthy diet (fewer calories, more high fiber foods, whole  grains and non-starchy vegetables, lower fat meat and fish, low-fat diary include healthy oils) regular exercise (physical activity) and weight loss Opthalmology exam discussed  Nutritional consult recommended Regular dental visits encouraged Home BP monitoring also encouraged goal <130/80    Relevant Orders   HgB A1c (Completed)   Comp. Metabolic Panel (12)    Other Visit Diagnoses     Needs flu shot       Relevant Orders   Flu Vaccine QUAD 34moIM (Fluarix, Fluzone & Alfiuria Quad PF) (Completed)   Flu vaccine need       Pain in both hands     Discussed OA and symptoms Education provided Strong topical prescribed   Relevant Medications   Diclofenac Sodium (PENNSAID) 2 % SOLN   Other Relevant Orders   Sedimentation rate   Sedimentation rate        Meds ordered this encounter  Medications   Diclofenac Sodium (PENNSAID) 2 % SOLN    Sig: Apply 1 application topically 4 (four) times daily.    Dispense:  112 g    Refill:  5    Order Specific Question:   Supervising Provider    Answer:   JTresa Garter[[[id number]   Follow-up: Return in about 3 months (around 12/09/2021).    CVevelyn Francois NP

## 2021-09-08 NOTE — Patient Instructions (Addendum)
Arthritis Arthritis means joint pain. It can also mean joint disease. A joint is a place where bones come together. There are more than 100 types of arthritis. What are the causes? This condition may be caused by: Wear and tear of a joint. This is the most common cause. A lot of acid in the blood, which leads to pain in the joint (gout). Pain and swelling (inflammation) in a joint. Infection of a joint. Injuries in the joint. A reaction to medicines (allergy). In some cases, the cause may not be known. What are the signs or symptoms? Symptoms of this condition include: Redness at a joint. Swelling at a joint. Stiffness at a joint. Warmth coming from the joint. A fever. A feeling of being sick. How is this treated? This condition may be treated with: Treating the cause, if it is known. Rest. Raising (elevating) the joint. Putting cold or hot packs on the joint. Medicines to treat symptoms and reduce pain and swelling. Shots of medicines (cortisone) into the joint. You may also be told to make changes in your life, such as doing exercises and losing weight. Follow these instructions at home: Medicines Take over-the-counter and prescription medicines only as told by your doctor. Do not take aspirin for pain if your doctor says that you may have gout. Activity Rest your joint if your doctor tells you to. Avoid activities that make the pain worse. Exercise your joint regularly as told by your doctor. Try doing exercises like: Swimming. Water aerobics. Biking. Walking. Managing pain, stiffness, and swelling   If told, put ice on the affected area. Put ice in a plastic bag. Place a towel between your skin and the bag. Leave the ice on for 20 minutes, 2-3 times per day. If your joint is swollen, raise (elevate) it above the level of your heart if told by your doctor. If your joint feels stiff in the morning, try taking a warm shower. If told, put heat on the affected area. Do  this as often as told by your doctor. Use the heat source that your doctor recommends, such as a moist heat pack or a heating pad. If you have diabetes, do not apply heat without asking your doctor. To apply heat: Place a towel between your skin and the heat source. Leave the heat on for 20-30 minutes. Remove the heat if your skin turns bright red. This is very important if you are unable to feel pain, heat, or cold. You may have a greater risk of getting burned. General instructions Do not use any products that contain nicotine or tobacco, such as cigarettes, e-cigarettes, and chewing tobacco. If you need help quitting, ask your doctor. Keep all follow-up visits as told by your doctor. This is important. Contact a doctor if: The pain gets worse. You have a fever. Get help right away if: You have very bad pain in your joint. You have swelling in your joint. Your joint is red. Many joints become painful and swollen. You have very bad back pain. Your leg is very weak. You cannot control your pee (urine) or poop (stool). Summary Arthritis means joint pain. It can also mean joint disease. A joint is a place where bones come together. The most common cause of this condition is wear and tear of a joint. Symptoms of this condition include redness, swelling, or stiffness of the joint. This condition is treated with rest, raising the joint, medicines, and putting cold or hot packs on the joint. Follow your  doctor's instructions about medicines, activity, exercises, and other home care treatments. This information is not intended to replace advice given to you by your health care provider. Make sure you discuss any questions you have with your health care provider. Document Revised: 09/26/2018 Document Reviewed: 09/26/2018 Elsevier Patient Education  2022 [employer].  Diabetes Mellitus and Nutrition, Adult When you have diabetes, or diabetes mellitus, it is very important to have healthy eating  habits because your blood sugar (glucose) levels are greatly affected by what you eat and drink. Eating healthy foods in the right amounts, at about the same times every day, can help you: Manage your blood glucose. Lower your risk of heart disease. Improve your blood pressure. Reach or maintain a healthy weight. What can affect my meal plan? Every person with diabetes is different, and each person has different needs for a meal plan. Your health care provider may recommend that you work with a dietitian to make a meal plan that is best for you. Your meal plan may vary depending on factors such as: The calories you need. The medicines you take. Your weight. Your blood glucose, blood pressure, and cholesterol levels. Your activity level. Other health conditions you have, such as heart or kidney disease. How do carbohydrates affect me? Carbohydrates, also called carbs, affect your blood glucose level more than any other type of food. Eating carbs raises the amount of glucose in your blood. It is important to know how many carbs you can safely have in each meal. This is different for every person. Your dietitian can help you calculate how many carbs you should have at each meal and for each snack. How does alcohol affect me? Alcohol can cause a decrease in blood glucose (hypoglycemia), especially if you use insulin or take certain diabetes medicines by mouth. Hypoglycemia can be a life-threatening condition. Symptoms of hypoglycemia, such as sleepiness, dizziness, and confusion, are similar to symptoms of having too much alcohol. Do not drink alcohol if: Your health care provider tells you not to drink. You are pregnant, may be pregnant, or are planning to become pregnant. If you drink alcohol: Limit how much you have to: 0-1 drink a day for women. 0-2 drinks a day for men. Know how much alcohol is in your drink. In the [place] one drink equals one 12 oz bottle of beer (355 mL), one 5 oz glass of  wine (148 mL), or one 1 oz glass of hard liquor (44 mL). Keep yourself hydrated with water, diet soda, or unsweetened iced tea. Keep in mind that regular soda, juice, and other mixers may contain a lot of sugar and must be counted as carbs. What are tips for following this plan? Reading food labels Start by checking the serving size on the Nutrition Facts label of packaged foods and drinks. The number of calories and the amount of carbs, fats, and other nutrients listed on the label are based on one serving of the item. Many items contain more than one serving per package. Check the total grams (g) of carbs in one serving. Check the number of grams of saturated fats and trans fats in one serving. Choose foods that have a low amount or none of these fats. Check the number of milligrams (mg) of salt (sodium) in one serving. Most people should limit total sodium intake to less than 2,300 mg per day. Always check the nutrition information of foods labeled as "low-fat" or "nonfat." These foods may be higher in added sugar or  refined carbs and should be avoided. Talk to your dietitian to identify your daily goals for nutrients listed on the label. Shopping Avoid buying canned, pre-made, or processed foods. These foods tend to be high in fat, sodium, and added sugar. Shop around the outside edge of the grocery store. This is where you will most often find fresh fruits and vegetables, bulk grains, fresh meats, and fresh dairy products. Cooking Use low-heat cooking methods, such as baking, instead of high-heat cooking methods, such as deep frying. Cook using healthy oils, such as olive, canola, or sunflower oil. Avoid cooking with butter, cream, or high-fat meats. Meal planning Eat meals and snacks regularly, preferably at the same times every day. Avoid going long periods of time without eating. Eat foods that are high in fiber, such as fresh fruits, vegetables, beans, and whole grains. Eat 4-6 oz  (112-168 g) of lean protein each day, such as lean meat, chicken, fish, eggs, or tofu. One ounce (oz) (28 g) of lean protein is equal to: 1 oz (28 g) of meat, chicken, or fish. 1 egg.  cup (62 g) of tofu. Eat some foods each day that contain healthy fats, such as avocado, nuts, seeds, and fish. What foods should I eat? Fruits Berries. Apples. Oranges. Peaches. Apricots. Plums. Grapes. Mangoes. Papayas. Pomegranates. Kiwi. Cherries. Vegetables Leafy greens, including lettuce, spinach, kale, chard, collard greens, mustard greens, and cabbage. Beets. Cauliflower. Broccoli. Carrots. Green beans. Tomatoes. Peppers. Onions. Cucumbers. Brussels sprouts. Grains Whole grains, such as whole-wheat or whole-grain bread, crackers, tortillas, cereal, and pasta. Unsweetened oatmeal. Quinoa. Brown or wild rice. Meats and other proteins Seafood. Poultry without skin. Lean cuts of poultry and beef. Tofu. Nuts. Seeds. Dairy Low-fat or fat-free dairy products such as milk, yogurt, and cheese. The items listed above may not be a complete list of foods and beverages you can eat and drink. Contact a dietitian for more information. What foods should I avoid? Fruits Fruits canned with syrup. Vegetables Canned vegetables. Frozen vegetables with butter or cream sauce. Grains Refined white flour and flour products such as bread, pasta, snack foods, and cereals. Avoid all processed foods. Meats and other proteins Fatty cuts of meat. Poultry with skin. Breaded or fried meats. Processed meat. Avoid saturated fats. Dairy Full-fat yogurt, cheese, or milk. Beverages Sweetened drinks, such as soda or iced tea. The items listed above may not be a complete list of foods and beverages you should avoid. Contact a dietitian for more information. Questions to ask a health care provider Do I need to meet with a certified diabetes care and education specialist? Do I need to meet with a dietitian? What number can I call if I  have questions? When are the best times to check my blood glucose? Where to find more information: American Diabetes Association: diabetes.org Academy of Nutrition and Dietetics: eatright.[employer] of Diabetes and Digestive and Kidney Diseases: [employer] Association of Diabetes Care & Education Specialists: diabeteseducator.org Summary It is important to have healthy eating habits because your blood sugar (glucose) levels are greatly affected by what you eat and drink. It is important to use alcohol carefully. A healthy meal plan will help you manage your blood glucose and lower your risk of heart disease. Your health care provider may recommend that you work with a dietitian to make a meal plan that is best for you. This information is not intended to replace advice given to you by your health care provider. Make sure you discuss any questions you have  with your health care provider. Document Revised: 05/22/2020 Document Reviewed: 05/22/2020 Elsevier Patient Education  2022 Elsevier Inc.  Preventive Care 59-46 Years Old, Female Preventive care refers to lifestyle choices and visits with your health care provider that can promote health and wellness. Preventive care visits are also called wellness exams. What can I expect for my preventive care visit? Counseling Your health care provider may ask you questions about your: Medical history, including: Past medical problems. Family medical history. Pregnancy history. Current health, including: Menstrual cycle. Method of birth control. Emotional well-being. Home life and relationship well-being. Sexual activity and sexual health. Lifestyle, including: Alcohol, nicotine or tobacco, and drug use. Access to firearms. Diet, exercise, and sleep habits. Work and work Statistician. Sunscreen use. Safety issues such as seatbelt and bike helmet use. Physical exam Your health care provider will check your: Height and weight.  These may be used to calculate your BMI (body mass index). BMI is a measurement that tells if you are at a healthy weight. Waist circumference. This measures the distance around your waistline. This measurement also tells if you are at a healthy weight and may help predict your risk of certain diseases, such as type 2 diabetes and high blood pressure. Heart rate and blood pressure. Body temperature. Skin for abnormal spots. What immunizations do I need? Vaccines are usually given at various ages, according to a schedule. Your health care provider will recommend vaccines for you based on your age, medical history, and lifestyle or other factors, such as travel or where you work. What tests do I need? Screening Your health care provider may recommend screening tests for certain conditions. This may include: Lipid and cholesterol levels. Diabetes screening. This is done by checking your blood sugar (glucose) after you have not eaten for a while (fasting). Pelvic exam and Pap test. Hepatitis B test. Hepatitis C test. HIV (human immunodeficiency virus) test. STI (sexually transmitted infection) testing, if you are at risk. Lung cancer screening. Colorectal cancer screening. Mammogram. Talk with your health care provider about when you should start having regular mammograms. This may depend on whether you have a family history of breast cancer. BRCA-related cancer screening. This may be done if you have a family history of breast, ovarian, tubal, or peritoneal cancers. Bone density scan. This is done to screen for osteoporosis. Talk with your health care provider about your test results, treatment options, and if necessary, the need for more tests. Follow these instructions at home: Eating and drinking  Eat a diet that includes fresh fruits and vegetables, whole grains, lean protein, and low-fat dairy products. Take vitamin and mineral supplements as recommended by your health care provider. Do  not drink alcohol if: Your health care provider tells you not to drink. You are pregnant, may be pregnant, or are planning to become pregnant. If you drink alcohol: Limit how much you have to 0-1 drink a day. Know how much alcohol is in your drink. In the [place] one drink equals one 12 oz bottle of beer (355 mL), one 5 oz glass of wine (148 mL), or one 1 oz glass of hard liquor (44 mL). Lifestyle Brush your teeth every morning and night with fluoride toothpaste. Floss one time each day. Exercise for at least 30 minutes 5 or more days each week. Do not use any products that contain nicotine or tobacco. These products include cigarettes, chewing tobacco, and vaping devices, such as e-cigarettes. If you need help quitting, ask your health care provider. Do not  use drugs. If you are sexually active, practice safe sex. Use a condom or other form of protection to prevent STIs. If you do not wish to become pregnant, use a form of birth control. If you plan to become pregnant, see your health care provider for a prepregnancy visit. Take aspirin only as told by your health care provider. Make sure that you understand how much to take and what form to take. Work with your health care provider to find out whether it is safe and beneficial for you to take aspirin daily. Find healthy ways to manage stress, such as: Meditation, yoga, or listening to music. Journaling. Talking to a trusted person. Spending time with friends and family. Minimize exposure to UV radiation to reduce your risk of skin cancer. Safety Always wear your seat belt while driving or riding in a vehicle. Do not drive: If you have been drinking alcohol. Do not ride with someone who has been drinking. When you are tired or distracted. While texting. If you have been using any mind-altering substances or drugs. Wear a helmet and other protective equipment during sports activities. If you have firearms in your house, make sure you  follow all gun safety procedures. Seek help if you have been physically or sexually abused. What's next? Visit your health care provider once a year for an annual wellness visit. Ask your health care provider how often you should have your eyes and teeth checked. Stay up to date on all vaccines. This information is not intended to replace advice given to you by your health care provider. Make sure you discuss any questions you have with your health care provider. Document Revised: 04/16/2021 Document Reviewed: 04/16/2021 Elsevier Patient Education  [place].

## 2021-09-09 LAB — COMP. METABOLIC PANEL (12)
AST: 13 IU/L (ref 0–40)
Albumin/Globulin Ratio: 1.7 (ref 1.2–2.2)
Albumin: 4.3 g/dL (ref 3.8–4.9)
Alkaline Phosphatase: 71 IU/L (ref 44–121)
BUN/Creatinine Ratio: 11 (ref 9–23)
BUN: 7 mg/dL (ref 6–24)
Bilirubin Total: 0.3 mg/dL (ref 0.0–1.2)
Calcium: 9.4 mg/dL (ref 8.7–10.2)
Chloride: 103 mmol/L (ref 96–106)
Creatinine, Ser: 0.66 mg/dL (ref 0.57–1.00)
Globulin, Total: 2.6 g/dL (ref 1.5–4.5)
Glucose: 191 mg/dL — ABNORMAL HIGH (ref 70–99)
Potassium: 4 mmol/L (ref 3.5–5.2)
Sodium: 139 mmol/L (ref 134–144)
Total Protein: 6.9 g/dL (ref 6.0–8.5)
eGFR: 105 mL/min/{1.73_m2} (ref >59)

## 2021-09-09 LAB — SEDIMENTATION RATE: Sed Rate: 15 mm/hr (ref 0–40)

## 2021-09-24 ENCOUNTER — Other Ambulatory Visit: Payer: Self-pay

## 2021-09-29 ENCOUNTER — Other Ambulatory Visit: Payer: Self-pay

## 2021-10-30 ENCOUNTER — Other Ambulatory Visit: Payer: Self-pay

## 2021-11-25 ENCOUNTER — Other Ambulatory Visit: Payer: Self-pay

## 2021-11-26 ENCOUNTER — Other Ambulatory Visit: Payer: Self-pay

## 2021-12-10 ENCOUNTER — Ambulatory Visit: Payer: Self-pay | Admitting: Nurse Practitioner

## 2021-12-26 ENCOUNTER — Other Ambulatory Visit: Payer: Self-pay

## 2022-01-14 ENCOUNTER — Other Ambulatory Visit: Payer: Self-pay | Admitting: Nurse Practitioner

## 2022-01-14 ENCOUNTER — Other Ambulatory Visit: Payer: Self-pay

## 2022-01-15 ENCOUNTER — Other Ambulatory Visit: Payer: Self-pay

## 2022-01-21 ENCOUNTER — Other Ambulatory Visit: Payer: Self-pay

## 2022-01-29 ENCOUNTER — Encounter: Payer: Self-pay | Admitting: Nurse Practitioner

## 2022-01-29 ENCOUNTER — Other Ambulatory Visit: Payer: Self-pay

## 2022-01-29 ENCOUNTER — Ambulatory Visit (INDEPENDENT_AMBULATORY_CARE_PROVIDER_SITE_OTHER): Payer: Self-pay | Admitting: Nurse Practitioner

## 2022-01-29 VITALS — BP 146/96 | HR 87 | Wt 140.0 lb

## 2022-01-29 DIAGNOSIS — Z0100 Encounter for examination of eyes and vision without abnormal findings: Secondary | ICD-10-CM

## 2022-01-29 DIAGNOSIS — M65331 Trigger finger, right middle finger: Secondary | ICD-10-CM

## 2022-01-29 DIAGNOSIS — E1140 Type 2 diabetes mellitus with diabetic neuropathy, unspecified: Secondary | ICD-10-CM

## 2022-01-29 LAB — POCT GLYCOSYLATED HEMOGLOBIN (HGB A1C)
HbA1c POC (<> result, manual entry): 8.1 % (ref 4.0–5.6)
HbA1c, POC (controlled diabetic range): 8.1 % — AB (ref 0.0–7.0)
HbA1c, POC (prediabetic range): 8.1 % — AB (ref 5.7–6.4)
Hemoglobin A1C: 8.1 % — AB (ref 4.0–5.6)

## 2022-01-29 MED ORDER — TRULICITY 1.5 MG/0.5ML ~~LOC~~ SOAJ
1.5000 mg | SUBCUTANEOUS | 3 refills | Status: DC
  Filled 2022-01-29: qty 2, 28d supply, fill #0
  Filled 2022-02-27: qty 2, 28d supply, fill #1
  Filled 2022-03-26: qty 2, 28d supply, fill #2
  Filled 2022-04-24: qty 2, 28d supply, fill #3
  Filled 2022-05-22: qty 2, 28d supply, fill #4
  Filled 2022-06-18: qty 2, 28d supply, fill #5
  Filled 2022-07-16: qty 2, 28d supply, fill #6

## 2022-01-29 MED ORDER — IBUPROFEN 600 MG PO TABS
600.0000 mg | ORAL_TABLET | Freq: Three times a day (TID) | ORAL | 0 refills | Status: DC | PRN
  Filled 2022-01-29: qty 30, 10d supply, fill #0

## 2022-01-29 MED ORDER — AMITRIPTYLINE HCL 75 MG PO TABS
ORAL_TABLET | ORAL | 0 refills | Status: DC
  Filled 2022-01-29: qty 727, fill #0
  Filled 2022-03-26: qty 60, 30d supply, fill #0
  Filled 2022-04-24: qty 60, 30d supply, fill #1
  Filled 2022-06-18: qty 60, 30d supply, fill #2
  Filled 2022-07-16 – 2022-07-17 (×3): qty 60, 30d supply, fill #3
  Filled 2022-07-17 (×2): qty 30, 15d supply, fill #3
  Filled 2022-08-14: qty 60, 30d supply, fill #3
  Filled 2022-09-11: qty 60, 30d supply, fill #4
  Filled 2022-10-09: qty 60, 30d supply, fill #5

## 2022-01-29 NOTE — Assessment & Plan Note (Signed)
-   POCT glycosylated hemoglobin (Hb A1C) ?- Dulaglutide (TRULICITY) 1.5 0000000 SOPN; Inject 1.5 mg into the skin once a week.  Dispense: 6 mL; Refill: 3 ?- Microalbumin / creatinine urine ratio ?- CBC ?- Basic metabolic panel ?-referral to optometry  ? ?Diabetic diet ? ?Start walking routine ? ?Lab Results  ?Component Value Date  ? HGBA1C 8.1 (A) 01/29/2022  ? HGBA1C 8.1 01/29/2022  ? HGBA1C 8.1 (A) 01/29/2022  ? HGBA1C 8.1 (A) 01/29/2022  ? ? ? ?2. Trigger middle finger of right hand ? ?- ibuprofen (ADVIL) 600 MG tablet; Take 1 tablet (600 mg total) by mouth every 8 (eight) hours as needed.  Dispense: 30 tablet; Refill: 0 ? ? ? ? ?Follow up: ? ?Follow up in 3 months - diabetes ? ? ? ? ?

## 2022-01-29 NOTE — Progress Notes (Signed)
_0  ID: Shelley Bartlett, female    DOB: 05-05-1966, 56 y.o.   MRN: [id number] ? ?Chief Complaint  ?Patient presents with  ? Diabetes  ?  Follow up - needs refill on trulicity  ? ? ?Referring provider: ?Vevelyn Francois, NP ? ?56 year old female with history of diabetes and Goiter. ? ?HPI ? ?Patient presents today with her husband.  Swahili interpreter was used for this visit.  Patient presents for diabetes follow-up.  She states that overall she has been doing well.  She does need a refill on her Trulicity.  She states that she is tolerating this well.  She complains today of hand pain with trigger finger to right middle finger.  It appears that patient has complained about this on previous visits in November.  She had declined orthopedic referral at that time.  We will trial short course of anti-inflammatory medication.  Patient also requested a referral to an eye doctor today.  She does have diabetes and has not had regular eye care.  We discussed that we will place this referral today for her. Denies f/c/s, n/v/d, hemoptysis, PND, chest pain or edema. ? ?  ? ?Lab Results  ?Component Value Date  ? HGBA1C 8.1 (A) 01/29/2022  ? HGBA1C 8.1 01/29/2022  ? HGBA1C 8.1 (A) 01/29/2022  ? HGBA1C 8.1 (A) 01/29/2022  ? ? ? ? ? ? ?No Known Allergies ? ?Immunization History  ?Administered Date(s) Administered  ? Hepatitis A, Adult 09/19/2020  ? Hepatitis B 07/11/2019  ? Influenza,inj,Quad PF,6+ Mos 09/19/2020, 10/14/2020, 09/08/2021  ? MMR 07/11/2019  ? Td 07/11/2019  ? Varicella 09/19/2020  ? ? ?Past Medical History:  ?Diagnosis Date  ? Diabetes ([place])   ? Goiter   ? ? ?Tobacco History: ?Social History  ? ?Tobacco Use  ?Smoking Status Never  ?Smokeless Tobacco Never  ? ?Counseling given: Not Answered ? ? ?Outpatient Encounter Medications as of 01/29/2022  ?Medication Sig  ? ibuprofen (ADVIL) 600 MG tablet Take 1 tablet (600 mg total) by mouth every 8 (eight) hours as needed.  ? Accu-Chek Softclix Lancets lancets 1 each  by Other route in the morning, at noon, in the evening, and at bedtime. Use as instructed  ? amitriptyline (ELAVIL) 75 MG tablet Take 1 tablet (75 mg total) by mouth at bedtime for 7 days, THEN 2 tablets (150 mg total) at bedtime.  ? Diclofenac Sodium (PENNSAID) 2 % SOLN Apply 1 application topically 4 (four) times daily.  ? Dulaglutide (TRULICITY) 1.5 XL/2.4MW SOPN Inject 1.5 mg into the skin once a week.  ? glucose blood (ACCU-CHEK GUIDE) test strip Use four times daily to check blood sugars.  ? [DISCONTINUED] amitriptyline (ELAVIL) 75 MG tablet Take 1 tablet (75 mg total) by mouth at bedtime for 7 days, THEN 2 tablets (150 mg total) at bedtime.  ? [DISCONTINUED] Dulaglutide (TRULICITY) 1.5 NU/2.7OZ SOPN Inject 1.5 mg into the skin once a week.  ? ?No facility-administered encounter medications on file as of 01/29/2022.  ? ? ? ?Review of Systems ? ?Review of Systems  ?Constitutional: Negative.   ?HENT: Negative.    ?Cardiovascular: Negative.   ?Gastrointestinal: Negative.   ?Musculoskeletal:  Positive for arthralgias.  ?     Bilateral hand pain to joints and trigger finger to right middle finger.   ?Allergic/Immunologic: Negative.   ?Neurological: Negative.   ?Psychiatric/Behavioral: Negative.     ? ? ? ?Physical Exam ? ?BP (!) 146/96   Pulse 87   Wt 140 lb (63.5 kg)  SpO2 100%   BMI 27.80 kg/m?  ? ?Wt Readings from Last 5 Encounters:  ?01/29/22 140 lb (63.5 kg)  ?09/08/21 135 lb 9.6 oz (61.5 kg)  ?05/14/21 135 lb 0.4 oz (61.2 kg)  ?02/12/21 134 lb (60.8 kg)  ?01/15/21 131 lb (59.4 kg)  ? ? ? ?Physical Exam ?Vitals and nursing note reviewed.  ?Constitutional:   ?   General: She is not in acute distress. ?   Appearance: She is well-developed.  ?Cardiovascular:  ?   Rate and Rhythm: Normal rate and regular rhythm.  ?Pulmonary:  ?   Effort: Pulmonary effort is normal.  ?   Breath sounds: Normal breath sounds.  ?Musculoskeletal:  ?   Comments: Trigger finger noted to right middle finger.  ?Neurological:  ?    Mental Status: She is alert and oriented to person, place, and time.  ? ? ? ?Lab Results: ? ?CBC ?   ?Component Value Date/Time  ? WBC 4.4 10/04/2020 1335  ? RBC 5.20 (H) 10/04/2020 1335  ? HGB 14.2 10/04/2020 1335  ? HGB 13.5 05/07/2020 1735  ? HCT 43.2 10/04/2020 1335  ? HCT 41.9 05/07/2020 1735  ? PLT 154 10/04/2020 1335  ? PLT 170 05/07/2020 1735  ? MCV 83.1 10/04/2020 1335  ? MCV 83 05/07/2020 1735  ? MCH 27.3 10/04/2020 1335  ? MCHC 32.9 10/04/2020 1335  ? RDW 13.3 10/04/2020 1335  ? RDW 13.8 05/07/2020 1735  ? LYMPHSABS 2.0 05/07/2020 1735  ? EOSABS 0.0 05/07/2020 1735  ? BASOSABS 0.0 05/07/2020 1735  ? ? ?BMET ?   ?Component Value Date/Time  ? NA 139 09/08/2021 1415  ? K 4.0 09/08/2021 1415  ? CL 103 09/08/2021 1415  ? CO2 19 (L) 10/14/2020 1528  ? GLUCOSE 191 (H) 09/08/2021 1415  ? GLUCOSE 159 (H) 10/04/2020 1335  ? BUN 7 09/08/2021 1415  ? CREATININE 0.66 09/08/2021 1415  ? CALCIUM 9.4 09/08/2021 1415  ? GFRNONAA 103 10/14/2020 1528  ? GFRNONAA >60 10/04/2020 1335  ? GFRAA 118 10/14/2020 1528  ? ? ?BNP ?No results found for: BNP ? ?ProBNP ?No results found for: PROBNP ? ?Imaging: ?No results found. ? ? ?Assessment & Plan:  ? ?Type 2 diabetes mellitus with diabetic neuropathy, without long-term current use of insulin (HCC) ?- POCT glycosylated hemoglobin (Hb A1C) ?- Dulaglutide (TRULICITY) 1.5 MC/8.0EM SOPN; Inject 1.5 mg into the skin once a week.  Dispense: 6 mL; Refill: 3 ?- Microalbumin / creatinine urine ratio ?- CBC ?- Basic metabolic panel ?-referral to optometry  ? ?Diabetic diet ? ?Start walking routine ? ?Lab Results  ?Component Value Date  ? HGBA1C 8.1 (A) 01/29/2022  ? HGBA1C 8.1 01/29/2022  ? HGBA1C 8.1 (A) 01/29/2022  ? HGBA1C 8.1 (A) 01/29/2022  ? ? ? ?2. Trigger middle finger of right hand ? ?- ibuprofen (ADVIL) 600 MG tablet; Take 1 tablet (600 mg total) by mouth every 8 (eight) hours as needed.  Dispense: 30 tablet; Refill: 0 ? ? ? ? ?Follow up: ? ?Follow up in 3 months -  diabetes ? ? ? ? ? ? ? ?Fenton Foy, NP ?01/29/2022 ? ?

## 2022-01-29 NOTE — Patient Instructions (Addendum)
?  1. Type 2 diabetes mellitus with diabetic neuropathy, without long-term current use of insulin (HCC) ? ?- POCT glycosylated hemoglobin (Hb A1C) ?- Dulaglutide (TRULICITY) 1.5 MG/0.5ML SOPN; Inject 1.5 mg into the skin once a week.  Dispense: 6 mL; Refill: 3 ?- Microalbumin / creatinine urine ratio ?- CBC ?- Basic metabolic panel ?-referral to optometry  ? ?Diabetic diet ? ?Start walking routine ? ?Lab Results  ?Component Value Date  ? HGBA1C 8.1 (A) 01/29/2022  ? HGBA1C 8.1 01/29/2022  ? HGBA1C 8.1 (A) 01/29/2022  ? HGBA1C 8.1 (A) 01/29/2022  ? ? ? ?2. Trigger middle finger of right hand ? ?- ibuprofen (ADVIL) 600 MG tablet; Take 1 tablet (600 mg total) by mouth every 8 (eight) hours as needed.  Dispense: 30 tablet; Refill: 0 ? ? ? ? ?Follow up: ? ?Follow up in 3 months - diabetes ? ? ? ? ?

## 2022-01-30 LAB — BASIC METABOLIC PANEL
BUN/Creatinine Ratio: 12 (ref 9–23)
BUN: 8 mg/dL (ref 6–24)
CO2: 23 mmol/L (ref 20–29)
Calcium: 8.9 mg/dL (ref 8.7–10.2)
Chloride: 104 mmol/L (ref 96–106)
Creatinine, Ser: 0.67 mg/dL (ref 0.57–1.00)
Glucose: 180 mg/dL — ABNORMAL HIGH (ref 70–99)
Potassium: 4.1 mmol/L (ref 3.5–5.2)
Sodium: 140 mmol/L (ref 134–144)
eGFR: 104 mL/min/{1.73_m2} (ref >59)

## 2022-01-30 LAB — CBC
Hematocrit: 38.9 % (ref 34.0–46.6)
Hemoglobin: 13 g/dL (ref 11.1–15.9)
MCH: 28.7 pg (ref 26.6–33.0)
MCHC: 33.4 g/dL (ref 31.5–35.7)
MCV: 86 fL (ref 79–97)
Platelets: 178 10*3/uL (ref 150–450)
RBC: 4.53 x10E6/uL (ref 3.77–5.28)
RDW: 13.4 % (ref 11.7–15.4)
WBC: 4.7 10*3/uL (ref 3.4–10.8)

## 2022-01-31 LAB — MICROALBUMIN / CREATININE URINE RATIO
Creatinine, Urine: 36.2 mg/dL
Microalb/Creat Ratio: 70 mg/g creat — ABNORMAL HIGH (ref 0–29)
Microalbumin, Urine: 25.4 ug/mL

## 2022-02-27 ENCOUNTER — Other Ambulatory Visit: Payer: Self-pay

## 2022-03-26 ENCOUNTER — Other Ambulatory Visit: Payer: Self-pay

## 2022-03-27 ENCOUNTER — Other Ambulatory Visit: Payer: Self-pay

## 2022-04-24 ENCOUNTER — Other Ambulatory Visit: Payer: Self-pay

## 2022-05-01 ENCOUNTER — Ambulatory Visit: Payer: Self-pay | Admitting: Nurse Practitioner

## 2022-05-06 ENCOUNTER — Ambulatory Visit: Payer: Self-pay | Admitting: Nurse Practitioner

## 2022-05-22 ENCOUNTER — Other Ambulatory Visit: Payer: Self-pay

## 2022-06-18 ENCOUNTER — Other Ambulatory Visit: Payer: Self-pay

## 2022-07-16 ENCOUNTER — Other Ambulatory Visit: Payer: Self-pay

## 2022-07-17 ENCOUNTER — Other Ambulatory Visit: Payer: Self-pay

## 2022-07-31 ENCOUNTER — Other Ambulatory Visit: Payer: Self-pay

## 2022-08-03 ENCOUNTER — Ambulatory Visit (INDEPENDENT_AMBULATORY_CARE_PROVIDER_SITE_OTHER): Payer: BC Managed Care – PPO | Admitting: Nurse Practitioner

## 2022-08-03 ENCOUNTER — Other Ambulatory Visit: Payer: Self-pay

## 2022-08-03 ENCOUNTER — Encounter: Payer: Self-pay | Admitting: Nurse Practitioner

## 2022-08-03 VITALS — BP 117/85 | HR 95 | Temp 97.0°F | Ht 59.5 in | Wt 139.0 lb

## 2022-08-03 DIAGNOSIS — G44209 Tension-type headache, unspecified, not intractable: Secondary | ICD-10-CM | POA: Diagnosis not present

## 2022-08-03 DIAGNOSIS — E1140 Type 2 diabetes mellitus with diabetic neuropathy, unspecified: Secondary | ICD-10-CM

## 2022-08-03 DIAGNOSIS — Z0000 Encounter for general adult medical examination without abnormal findings: Secondary | ICD-10-CM

## 2022-08-03 MED ORDER — EXCEDRIN MIGRAINE 250-250-65 MG PO TABS
2.0000 | ORAL_TABLET | Freq: Three times a day (TID) | ORAL | 0 refills | Status: DC | PRN
  Filled 2022-08-03: qty 30, 5d supply, fill #0

## 2022-08-03 MED ORDER — TRULICITY 1.5 MG/0.5ML ~~LOC~~ SOAJ
1.5000 mg | SUBCUTANEOUS | 3 refills | Status: DC
  Filled 2022-08-03: qty 6, 84d supply, fill #0
  Filled 2022-08-14: qty 2, 28d supply, fill #0
  Filled 2022-09-11: qty 2, 28d supply, fill #1
  Filled 2022-10-09: qty 2, 28d supply, fill #2
  Filled 2022-11-13: qty 2, 28d supply, fill #3

## 2022-08-03 NOTE — Progress Notes (Signed)
$'@Patient'j$  ID: Shelley Bartlett, female    DOB: 1966-09-12, 56 y.o.   MRN: [id number]  Chief Complaint  Patient presents with   Diabetes    Pt is here for 3 month's follow up visit. Pt states she has been having headaches for the past 2 weeks     Referring provider: No ref. provider found   HPI  56 year old female with history of diabetes and Goiter.   HPI   Patient presents today with her husband.  [place] interpreter was used for this visit.  Patient presents for diabetes follow-up.  She states that overall she has been doing well.  She does need a refill on her Trulicity.  She states that she is tolerating this well.  patient does complain of headaches for the past 2 weeks. Denies f/c/s, n/v/d, hemoptysis, PND, chest pain or edema.      No Known Allergies  Immunization History  Administered Date(s) Administered   Hepatitis A, Adult 09/19/2020   Hepatitis B 07/11/2019   Influenza,inj,Quad PF,6+ Mos 09/19/2020, 10/14/2020, 09/08/2021   MMR 07/11/2019   Td 07/11/2019   Varicella 09/19/2020    Past Medical History:  Diagnosis Date   Diabetes ([place])    Goiter     Tobacco History: Social History   Tobacco Use  Smoking Status Never  Smokeless Tobacco Never   Counseling given: Not Answered   Outpatient Encounter Medications as of 08/03/2022  Medication Sig   Accu-Chek Softclix Lancets lancets 1 each by Other route in the morning, at noon, in the evening, and at bedtime. Use as instructed   aspirin-acetaminophen-caffeine (EXCEDRIN MIGRAINE) 250-250-65 MG tablet Take 2 tablets by mouth every 8 (eight) hours as needed for headache.   amitriptyline (ELAVIL) 75 MG tablet Take 1 tablet (75 mg total) by mouth at bedtime for 7 days, THEN 2 tablets (150 mg total) at bedtime. (Patient not taking: Reported on 08/03/2022)   Diclofenac Sodium (PENNSAID) 2 % SOLN Apply 1 application topically 4 (four) times daily. (Patient not taking: Reported on 08/03/2022)   Dulaglutide  (TRULICITY) 1.5 VQ/0.0QQ SOPN Inject 1.5 mg into the skin once a week.   glucose blood (ACCU-CHEK GUIDE) test strip Use four times daily to check blood sugars. (Patient not taking: Reported on 08/03/2022)   ibuprofen (ADVIL) 600 MG tablet Take 1 tablet (600 mg total) by mouth every 8 (eight) hours as needed. (Patient not taking: Reported on 08/03/2022)   [DISCONTINUED] Dulaglutide (TRULICITY) 1.5 PY/1.9JK SOPN Inject 1.5 mg into the skin once a week. (Patient not taking: Reported on 08/03/2022)   No facility-administered encounter medications on file as of 08/03/2022.     Review of Systems  Review of Systems  Constitutional: Negative.   HENT: Negative.    Cardiovascular: Negative.   Gastrointestinal: Negative.   Allergic/Immunologic: Negative.   Neurological:  Positive for headaches.  Psychiatric/Behavioral: Negative.         Physical Exam  BP 117/85 (BP Location: Right Arm, Patient Position: Sitting, Cuff Size: Normal)   Pulse 95   Temp (!) 97 F (36.1 C)   Ht 4' 11.5" (1.511 m)   Wt 139 lb (63 kg)   SpO2 100%   BMI 27.60 kg/m   Wt Readings from Last 5 Encounters:  08/03/22 139 lb (63 kg)  01/29/22 140 lb (63.5 kg)  09/08/21 135 lb 9.6 oz (61.5 kg)  05/14/21 135 lb 0.4 oz (61.2 kg)  02/12/21 134 lb (60.8 kg)     Physical Exam Vitals and nursing note reviewed.  Constitutional:      General: She is not in acute distress.    Appearance: She is well-developed.  Cardiovascular:     Rate and Rhythm: Normal rate and regular rhythm.  Pulmonary:     Effort: Pulmonary effort is normal.     Breath sounds: Normal breath sounds.  Neurological:     Mental Status: She is alert and oriented to person, place, and time.      Lab Results:  CBC    Component Value Date/Time   WBC 4.7 01/29/2022 1001   WBC 4.4 10/04/2020 1335   RBC 4.53 01/29/2022 1001   RBC 5.20 (H) 10/04/2020 1335   HGB 13.0 01/29/2022 1001   HCT 38.9 01/29/2022 1001   PLT 178 01/29/2022 1001   MCV 86  01/29/2022 1001   MCH 28.7 01/29/2022 1001   MCH 27.3 10/04/2020 1335   MCHC 33.4 01/29/2022 1001   MCHC 32.9 10/04/2020 1335   RDW 13.4 01/29/2022 1001   LYMPHSABS 2.0 05/07/2020 1735   EOSABS 0.0 05/07/2020 1735   BASOSABS 0.0 05/07/2020 1735    BMET    Component Value Date/Time   NA 140 01/29/2022 1001   K 4.1 01/29/2022 1001   CL 104 01/29/2022 1001   CO2 23 01/29/2022 1001   GLUCOSE 180 (H) 01/29/2022 1001   GLUCOSE 159 (H) 10/04/2020 1335   BUN 8 01/29/2022 1001   CREATININE 0.67 01/29/2022 1001   CALCIUM 8.9 01/29/2022 1001   GFRNONAA 103 10/14/2020 1528   GFRNONAA >60 10/04/2020 1335   GFRAA 118 10/14/2020 1528      Assessment & Plan:   Type 2 diabetes mellitus with diabetic neuropathy, without long-term current use of insulin (HCC) - CBC - Comprehensive metabolic panel - TSH - Hemoglobin A1c - Lipid Panel  2. Type 2 diabetes mellitus with diabetic neuropathy, without long-term current use of insulin (HCC)  - Dulaglutide (TRULICITY) 1.5 IW/5.8KD SOPN; Inject 1.5 mg into the skin once a week.  Dispense: 6 mL; Refill: 3  3. Acute non intractable tension-type headache  - aspirin-acetaminophen-caffeine (EXCEDRIN MIGRAINE) 250-250-65 MG tablet; Take 2 tablets by mouth every 8 (eight) hours as needed for headache.  Dispense: 30 tablet; Refill: 0  Follow up:  Follow up in 3 months or sooner if needed     Fenton Foy, NP 08/03/2022

## 2022-08-03 NOTE — Patient Instructions (Signed)
1. Routine health maintenance  - CBC - Comprehensive metabolic panel - TSH - Hemoglobin A1c - Lipid Panel  2. Type 2 diabetes mellitus with diabetic neuropathy, without long-term current use of insulin (HCC)  - Dulaglutide (TRULICITY) 1.5 KC/1.2XN SOPN; Inject 1.5 mg into the skin once a week.  Dispense: 6 mL; Refill: 3  3. Acute non intractable tension-type headache  - aspirin-acetaminophen-caffeine (EXCEDRIN MIGRAINE) 250-250-65 MG tablet; Take 2 tablets by mouth every 8 (eight) hours as needed for headache.  Dispense: 30 tablet; Refill: 0  Follow up:  Follow up in 3 months or sooner if needed

## 2022-08-03 NOTE — Assessment & Plan Note (Signed)
-   CBC - Comprehensive metabolic panel - TSH - Hemoglobin A1c - Lipid Panel  2. Type 2 diabetes mellitus with diabetic neuropathy, without long-term current use of insulin (HCC)  - Dulaglutide (TRULICITY) 1.5 FB/5.1WC SOPN; Inject 1.5 mg into the skin once a week.  Dispense: 6 mL; Refill: 3  3. Acute non intractable tension-type headache  - aspirin-acetaminophen-caffeine (EXCEDRIN MIGRAINE) 250-250-65 MG tablet; Take 2 tablets by mouth every 8 (eight) hours as needed for headache.  Dispense: 30 tablet; Refill: 0  Follow up:  Follow up in 3 months or sooner if needed

## 2022-08-04 LAB — CBC
Hematocrit: 39.2 % (ref 34.0–46.6)
Hemoglobin: 13.1 g/dL (ref 11.1–15.9)
MCH: 28.9 pg (ref 26.6–33.0)
MCHC: 33.4 g/dL (ref 31.5–35.7)
MCV: 86 fL (ref 79–97)
Platelets: 209 10*3/uL (ref 150–450)
RBC: 4.54 x10E6/uL (ref 3.77–5.28)
RDW: 13.2 % (ref 11.7–15.4)
WBC: 5 10*3/uL (ref 3.4–10.8)

## 2022-08-04 LAB — LIPID PANEL
Chol/HDL Ratio: 3.6 ratio (ref 0.0–4.4)
Cholesterol, Total: 170 mg/dL (ref 100–199)
HDL: 47 mg/dL (ref >39)
LDL Chol Calc (NIH): 110 mg/dL — ABNORMAL HIGH (ref 0–99)
Triglycerides: 70 mg/dL (ref 0–149)
VLDL Cholesterol Cal: 13 mg/dL (ref 5–40)

## 2022-08-04 LAB — COMPREHENSIVE METABOLIC PANEL
ALT: 16 IU/L (ref 0–32)
AST: 14 IU/L (ref 0–40)
Albumin/Globulin Ratio: 1.8 (ref 1.2–2.2)
Albumin: 4.2 g/dL (ref 3.8–4.9)
Alkaline Phosphatase: 74 IU/L (ref 44–121)
BUN/Creatinine Ratio: 9 (ref 9–23)
BUN: 6 mg/dL (ref 6–24)
Bilirubin Total: 0.3 mg/dL (ref 0.0–1.2)
CO2: 19 mmol/L — ABNORMAL LOW (ref 20–29)
Calcium: 9.3 mg/dL (ref 8.7–10.2)
Chloride: 102 mmol/L (ref 96–106)
Creatinine, Ser: 0.69 mg/dL (ref 0.57–1.00)
Globulin, Total: 2.3 g/dL (ref 1.5–4.5)
Glucose: 210 mg/dL — ABNORMAL HIGH (ref 70–99)
Potassium: 4.5 mmol/L (ref 3.5–5.2)
Sodium: 138 mmol/L (ref 134–144)
Total Protein: 6.5 g/dL (ref 6.0–8.5)
eGFR: 103 mL/min/{1.73_m2} (ref >59)

## 2022-08-04 LAB — HEMOGLOBIN A1C
Est. average glucose Bld gHb Est-mCnc: 186 mg/dL
Hgb A1c MFr Bld: 8.1 % — ABNORMAL HIGH (ref 4.8–5.6)

## 2022-08-04 LAB — TSH: TSH: 1.17 u[IU]/mL (ref 0.450–4.500)

## 2022-08-14 ENCOUNTER — Other Ambulatory Visit: Payer: Self-pay

## 2022-09-02 ENCOUNTER — Other Ambulatory Visit: Payer: Self-pay

## 2022-09-11 ENCOUNTER — Other Ambulatory Visit: Payer: Self-pay

## 2022-10-09 ENCOUNTER — Other Ambulatory Visit: Payer: Self-pay

## 2022-10-12 ENCOUNTER — Other Ambulatory Visit: Payer: Self-pay

## 2022-11-05 ENCOUNTER — Ambulatory Visit: Payer: Self-pay | Admitting: Nurse Practitioner

## 2022-11-12 ENCOUNTER — Ambulatory Visit: Payer: Self-pay | Admitting: Nurse Practitioner

## 2022-11-13 ENCOUNTER — Other Ambulatory Visit: Payer: Self-pay

## 2022-11-16 ENCOUNTER — Other Ambulatory Visit: Payer: Self-pay

## 2022-11-16 ENCOUNTER — Encounter: Payer: Self-pay | Admitting: Nurse Practitioner

## 2022-11-16 ENCOUNTER — Ambulatory Visit (INDEPENDENT_AMBULATORY_CARE_PROVIDER_SITE_OTHER): Payer: BC Managed Care – PPO | Admitting: Nurse Practitioner

## 2022-11-16 VITALS — BP 116/74 | HR 96 | Temp 97.3°F | Resp 16 | Ht 59.5 in | Wt 144.0 lb

## 2022-11-16 DIAGNOSIS — E1140 Type 2 diabetes mellitus with diabetic neuropathy, unspecified: Secondary | ICD-10-CM | POA: Diagnosis not present

## 2022-11-16 LAB — POCT GLYCOSYLATED HEMOGLOBIN (HGB A1C)
HbA1c POC (<> result, manual entry): 9.4 % (ref 4.0–5.6)
HbA1c, POC (controlled diabetic range): 9.4 % — AB (ref 0.0–7.0)
HbA1c, POC (prediabetic range): 9.4 % — AB (ref 5.7–6.4)
Hemoglobin A1C: 9.4 % — AB (ref 4.0–5.6)

## 2022-11-16 MED ORDER — TRULICITY 1.5 MG/0.5ML ~~LOC~~ SOAJ
1.5000 mg | SUBCUTANEOUS | 3 refills | Status: DC
  Filled 2022-11-16: qty 6, 84d supply, fill #0
  Filled 2022-12-11: qty 2, 28d supply, fill #0
  Filled 2023-01-08: qty 2, 28d supply, fill #1
  Filled 2023-02-03: qty 2, 28d supply, fill #2
  Filled 2023-03-05: qty 2, 28d supply, fill #3
  Filled 2023-04-05: qty 2, 28d supply, fill #4
  Filled 2023-05-04: qty 2, 28d supply, fill #5
  Filled 2023-06-04: qty 2, 28d supply, fill #6
  Filled 2023-07-02: qty 2, 28d supply, fill #7
  Filled 2023-07-29: qty 2, 28d supply, fill #8
  Filled 2023-08-27: qty 2, 28d supply, fill #9
  Filled 2023-09-24: qty 2, 28d supply, fill #10
  Filled 2023-10-20: qty 2, 28d supply, fill #11

## 2022-11-16 MED ORDER — AMITRIPTYLINE HCL 75 MG PO TABS
ORAL_TABLET | ORAL | 0 refills | Status: DC
  Filled 2022-11-16 – 2022-12-11 (×2): qty 60, 30d supply, fill #0
  Filled 2023-02-03: qty 60, 30d supply, fill #1
  Filled 2023-04-05: qty 60, 30d supply, fill #2
  Filled 2023-07-29: qty 60, 30d supply, fill #3
  Filled 2023-09-24 – 2023-11-04 (×3): qty 60, 30d supply, fill #4

## 2022-11-16 NOTE — Patient Instructions (Signed)
1. Type 2 diabetes mellitus with diabetic neuropathy, without long-term current use of insulin (HCC)  - POCT glycosylated hemoglobin (Hb A1C) - Dulaglutide (TRULICITY) 1.5 VZ/4.8OL SOPN; Inject 1.5 mg into the skin once a week.  Dispense: 6 mL; Refill: 3 - AMB Referral to Pharmacy Medication Management  Follow up:  Follow up in 3 months

## 2022-11-16 NOTE — Assessment & Plan Note (Signed)
-  POCT glycosylated hemoglobin (Hb A1C) - Dulaglutide (TRULICITY) 1.5 UD/1.4HF SOPN; Inject 1.5 mg into the skin once a week.  Dispense: 6 mL; Refill: 3 - AMB Referral to Pharmacy Medication Management  Follow up:  Follow up in 3 months

## 2022-11-16 NOTE — Progress Notes (Signed)
@Patient  ID: Shelley Bartlett, female    DOB: 12/07/1965, 57 y.o.   MRN: [id number]  Chief Complaint  Patient presents with   Diabetes    Referring provider: Fenton Foy, NP   HPI  57 year old female with history of diabetes and Goiter.   Patient presents today for follow-up visit.  Overall she has been doing well.  She does need an eye appointment.  We will provide patient with a list of eye doctors accept her insurance.  Patient does have a daughter with her in office today that does speak English plus interpreter was used for this visit as well.  Denies f/c/s, n/v/d, hemoptysis, PND, leg swelling Denies chest pain or edema     No Known Allergies  Immunization History  Administered Date(s) Administered   Hepatitis A, Adult 09/19/2020   Hepatitis B 07/11/2019   Influenza,inj,Quad PF,6+ Mos 09/19/2020, 10/14/2020, 09/08/2021   MMR 07/11/2019   Td 07/11/2019   Varicella 09/19/2020    Past Medical History:  Diagnosis Date   Diabetes ([place])    Goiter     Tobacco History: Social History   Tobacco Use  Smoking Status Never  Smokeless Tobacco Never   Counseling given: Not Answered   Outpatient Encounter Medications as of 11/16/2022  Medication Sig   Accu-Chek Softclix Lancets lancets 1 each by Other route in the morning, at noon, in the evening, and at bedtime. Use as instructed   aspirin-acetaminophen-caffeine (EXCEDRIN MIGRAINE) 250-250-65 MG tablet Take 2 tablets by mouth every 8 (eight) hours as needed for headache.   Diclofenac Sodium (PENNSAID) 2 % SOLN Apply 1 application topically 4 (four) times daily.   glucose blood (ACCU-CHEK GUIDE) test strip Use four times daily to check blood sugars.   ibuprofen (ADVIL) 600 MG tablet Take 1 tablet (600 mg total) by mouth every 8 (eight) hours as needed.   [DISCONTINUED] amitriptyline (ELAVIL) 75 MG tablet Take 1 tablet (75 mg total) by mouth at bedtime for 7 days, THEN 2 tablets (150 mg total) at bedtime.    [DISCONTINUED] Dulaglutide (TRULICITY) 1.5 TD/3.2KG SOPN Inject 1.5 mg into the skin once a week.   amitriptyline (ELAVIL) 75 MG tablet Take 1 tablet (75 mg total) by mouth at bedtime for 7 days, THEN 2 tablets (150 mg total) at bedtime.   Dulaglutide (TRULICITY) 1.5 UR/4.2HC SOPN Inject 1.5 mg into the skin once a week.   No facility-administered encounter medications on file as of 11/16/2022.     Review of Systems  Review of Systems  Constitutional: Negative.   HENT: Negative.    Cardiovascular: Negative.   Gastrointestinal: Negative.   Allergic/Immunologic: Negative.   Neurological: Negative.   Psychiatric/Behavioral: Negative.         Physical Exam  BP 116/74   Pulse 96   Temp (!) 97.3 F (36.3 C) (Temporal)   Resp 16   Ht 4' 11.5" (1.511 m)   Wt 144 lb (65.3 kg)   SpO2 100%   BMI 28.60 kg/m   Wt Readings from Last 5 Encounters:  11/16/22 144 lb (65.3 kg)  08/03/22 139 lb (63 kg)  01/29/22 140 lb (63.5 kg)  09/08/21 135 lb 9.6 oz (61.5 kg)  05/14/21 135 lb 0.4 oz (61.2 kg)     Physical Exam Vitals and nursing note reviewed.  Constitutional:      General: She is not in acute distress.    Appearance: She is well-developed.  Cardiovascular:     Rate and Rhythm: Normal rate and  regular rhythm.  Pulmonary:     Effort: Pulmonary effort is normal.     Breath sounds: Normal breath sounds.  Neurological:     Mental Status: She is alert and oriented to person, place, and time.      Lab Results:  CBC    Component Value Date/Time   WBC 5.0 08/03/2022 1124   WBC 4.4 10/04/2020 1335   RBC 4.54 08/03/2022 1124   RBC 5.20 (H) 10/04/2020 1335   HGB 13.1 08/03/2022 1124   HCT 39.2 08/03/2022 1124   PLT 209 08/03/2022 1124   MCV 86 08/03/2022 1124   MCH 28.9 08/03/2022 1124   MCH 27.3 10/04/2020 1335   MCHC 33.4 08/03/2022 1124   MCHC 32.9 10/04/2020 1335   RDW 13.2 08/03/2022 1124   LYMPHSABS 2.0 05/07/2020 1735   EOSABS 0.0 05/07/2020 1735   BASOSABS  0.0 05/07/2020 1735    BMET    Component Value Date/Time   NA 138 08/03/2022 1124   K 4.5 08/03/2022 1124   CL 102 08/03/2022 1124   CO2 19 (L) 08/03/2022 1124   GLUCOSE 210 (H) 08/03/2022 1124   GLUCOSE 159 (H) 10/04/2020 1335   BUN 6 08/03/2022 1124   CREATININE 0.69 08/03/2022 1124   CALCIUM 9.3 08/03/2022 1124   GFRNONAA 103 10/14/2020 1528   GFRNONAA >60 10/04/2020 1335   GFRAA 118 10/14/2020 1528      Assessment & Plan:   Type 2 diabetes mellitus with diabetic neuropathy, without long-term current use of insulin (HCC) - POCT glycosylated hemoglobin (Hb A1C) - Dulaglutide (TRULICITY) 1.5 ZC/5.8IF SOPN; Inject 1.5 mg into the skin once a week.  Dispense: 6 mL; Refill: 3 - AMB Referral to Pharmacy Medication Management  Follow up:  Follow up in 3 months     Fenton Foy, NP 11/16/2022

## 2022-11-17 ENCOUNTER — Telehealth: Payer: Self-pay | Admitting: Pharmacist

## 2022-11-17 NOTE — Progress Notes (Signed)
Contacted patient regarding referral for diabetes from Fenton Foy, NP .   Spoke with patient's daughter, Lanae Boast.   Appointment scheduled   Catie Hedwig Morton, PharmD, [facility] [phone]

## 2022-11-23 ENCOUNTER — Other Ambulatory Visit: Payer: Self-pay

## 2022-12-02 ENCOUNTER — Other Ambulatory Visit: Payer: Self-pay

## 2022-12-02 DIAGNOSIS — Z1231 Encounter for screening mammogram for malignant neoplasm of breast: Secondary | ICD-10-CM

## 2022-12-11 ENCOUNTER — Other Ambulatory Visit: Payer: Self-pay

## 2022-12-23 ENCOUNTER — Other Ambulatory Visit: Payer: BC Managed Care – PPO | Admitting: Pharmacist

## 2022-12-23 NOTE — Progress Notes (Unsigned)
Started call, but patient's daughter requested we reschedule.   Catie Hedwig Morton, PharmD, [place], [facility] [phone]

## 2022-12-28 ENCOUNTER — Other Ambulatory Visit: Payer: BC Managed Care – PPO | Admitting: Pharmacist

## 2022-12-30 ENCOUNTER — Other Ambulatory Visit: Payer: BC Managed Care – PPO

## 2022-12-30 ENCOUNTER — Telehealth: Payer: Self-pay

## 2022-12-30 NOTE — Progress Notes (Signed)
Attempted to contact patient's daughter for scheduled appointment for medication management. Left HIPAA compliant message for patient to return my call at their convenience.    Joseph Art, Pharm.D. PGY-2 Ambulatory Care Pharmacy Resident

## 2023-01-06 ENCOUNTER — Telehealth: Payer: Self-pay

## 2023-01-06 NOTE — Progress Notes (Signed)
   Care Guide Note  01/06/2023 Name: Shelley Bartlett MRN: [id number] DOB: [birth year]  Referred by: Fenton Foy, NP Reason for referral : Care Coordination (Outreach to reschedule with pharm d )   Shelley Bartlett is a 57 y.o. year old female who is a primary care patient of Fenton Foy, NP. Shelley Bartlett was referred to the pharmacist for assistance related to DM.    Successful contact was made with the patient to discuss pharmacy services including being ready for the pharmacist to call at least 5 minutes before the scheduled appointment time, to have medication bottles and any blood sugar or blood pressure readings ready for review. The patient agreed to meet with the pharmacist via with the pharmacist via telephone visit on (date/time).  01/13/2023  [name], [place], [place] [address] Direct Dial: [phone] [email or web address]

## 2023-01-08 ENCOUNTER — Other Ambulatory Visit: Payer: Self-pay

## 2023-01-13 ENCOUNTER — Telehealth: Payer: Self-pay

## 2023-01-13 ENCOUNTER — Other Ambulatory Visit: Payer: BC Managed Care – PPO

## 2023-01-13 NOTE — Progress Notes (Signed)
Attempted to contact patient for scheduled appointment for medication management. Left HIPAA compliant message for patient to return my call at their convenience.    Parthiv Mucci, PharmD PGY2 Ambulatory Care Pharmacy Resident  

## 2023-01-22 ENCOUNTER — Telehealth: Payer: Self-pay

## 2023-01-22 NOTE — Progress Notes (Signed)
   Care Guide Note  01/22/2023 Name: Shelley Bartlett MRN: [id number] DOB: 1966-05-08  Referred by: Fenton Foy, NP Reason for referral : Care Coordination (Outreach to reschedule with Pharm d )   Shelley Bartlett is a 57 y.o. year old female who is a primary care patient of Fenton Foy, NP. Shelley Bartlett was referred to the pharmacist for assistance related to DM.    An unsuccessful telephone outreach was attempted today to contact the patient who was referred to the pharmacy team for assistance with medication management. Additional attempts will be made to contact the patient.   [name], [place], [place] [address] Direct Dial: [phone] [email or web address]

## 2023-02-03 ENCOUNTER — Ambulatory Visit: Payer: BC Managed Care – PPO

## 2023-02-03 ENCOUNTER — Other Ambulatory Visit: Payer: Self-pay

## 2023-02-04 ENCOUNTER — Other Ambulatory Visit: Payer: Self-pay

## 2023-02-05 ENCOUNTER — Other Ambulatory Visit: Payer: Self-pay

## 2023-02-08 ENCOUNTER — Ambulatory Visit: Payer: BC Managed Care – PPO

## 2023-02-15 ENCOUNTER — Ambulatory Visit: Payer: BC Managed Care – PPO | Admitting: Nurse Practitioner

## 2023-03-04 ENCOUNTER — Ambulatory Visit: Payer: BC Managed Care – PPO

## 2023-03-05 ENCOUNTER — Other Ambulatory Visit: Payer: Self-pay

## 2023-04-05 ENCOUNTER — Other Ambulatory Visit: Payer: Self-pay

## 2023-04-06 ENCOUNTER — Other Ambulatory Visit: Payer: Self-pay

## 2023-05-04 ENCOUNTER — Other Ambulatory Visit: Payer: Self-pay

## 2023-05-20 ENCOUNTER — Other Ambulatory Visit: Payer: Self-pay

## 2023-06-04 ENCOUNTER — Other Ambulatory Visit: Payer: Self-pay

## 2023-07-02 ENCOUNTER — Other Ambulatory Visit: Payer: Self-pay

## 2023-07-13 ENCOUNTER — Other Ambulatory Visit: Payer: Self-pay

## 2023-07-29 ENCOUNTER — Other Ambulatory Visit: Payer: Self-pay

## 2023-07-30 ENCOUNTER — Other Ambulatory Visit: Payer: Self-pay

## 2023-08-27 ENCOUNTER — Other Ambulatory Visit: Payer: Self-pay

## 2023-08-27 ENCOUNTER — Other Ambulatory Visit (HOSPITAL_COMMUNITY): Payer: Self-pay

## 2023-09-06 ENCOUNTER — Ambulatory Visit: Payer: Self-pay | Admitting: Nurse Practitioner

## 2023-09-07 ENCOUNTER — Other Ambulatory Visit: Payer: Self-pay

## 2023-09-13 ENCOUNTER — Ambulatory Visit: Payer: Self-pay | Admitting: Nurse Practitioner

## 2023-09-24 ENCOUNTER — Other Ambulatory Visit: Payer: Self-pay

## 2023-10-06 ENCOUNTER — Other Ambulatory Visit: Payer: Self-pay

## 2023-10-20 ENCOUNTER — Other Ambulatory Visit: Payer: Self-pay

## 2023-10-21 ENCOUNTER — Other Ambulatory Visit: Payer: Self-pay

## 2023-10-28 ENCOUNTER — Other Ambulatory Visit: Payer: Self-pay

## 2023-10-29 ENCOUNTER — Other Ambulatory Visit: Payer: Self-pay

## 2023-11-01 ENCOUNTER — Other Ambulatory Visit: Payer: Self-pay

## 2023-11-04 ENCOUNTER — Other Ambulatory Visit: Payer: Self-pay

## 2023-11-18 ENCOUNTER — Other Ambulatory Visit: Payer: Self-pay

## 2023-11-18 ENCOUNTER — Other Ambulatory Visit: Payer: Self-pay | Admitting: Nurse Practitioner

## 2023-11-18 DIAGNOSIS — E1140 Type 2 diabetes mellitus with diabetic neuropathy, unspecified: Secondary | ICD-10-CM

## 2023-11-18 MED ORDER — TRULICITY 1.5 MG/0.5ML ~~LOC~~ SOAJ
1.5000 mg | SUBCUTANEOUS | 3 refills | Status: DC
  Filled 2023-11-18: qty 2, 28d supply, fill #0
  Filled 2023-12-24 – 2023-12-29 (×2): qty 2, 28d supply, fill #1
  Filled 2024-02-08 – 2024-03-01 (×3): qty 2, 28d supply, fill #2

## 2023-11-23 ENCOUNTER — Other Ambulatory Visit: Payer: Self-pay

## 2023-11-24 ENCOUNTER — Other Ambulatory Visit: Payer: Self-pay

## 2023-12-24 ENCOUNTER — Other Ambulatory Visit: Payer: Self-pay

## 2023-12-28 ENCOUNTER — Other Ambulatory Visit: Payer: Self-pay

## 2023-12-28 ENCOUNTER — Other Ambulatory Visit: Payer: Self-pay | Admitting: Nurse Practitioner

## 2023-12-29 ENCOUNTER — Other Ambulatory Visit: Payer: Self-pay

## 2023-12-29 MED ORDER — AMITRIPTYLINE HCL 75 MG PO TABS
ORAL_TABLET | ORAL | 0 refills | Status: DC
  Filled 2023-12-29: qty 61, 34d supply, fill #0

## 2024-01-04 ENCOUNTER — Other Ambulatory Visit: Payer: Self-pay

## 2024-02-08 ENCOUNTER — Other Ambulatory Visit: Payer: Self-pay

## 2024-02-09 ENCOUNTER — Other Ambulatory Visit: Payer: Self-pay

## 2024-02-10 ENCOUNTER — Other Ambulatory Visit: Payer: Self-pay

## 2024-02-11 ENCOUNTER — Other Ambulatory Visit: Payer: Self-pay

## 2024-02-14 ENCOUNTER — Other Ambulatory Visit: Payer: Self-pay

## 2024-02-17 ENCOUNTER — Other Ambulatory Visit: Payer: Self-pay

## 2024-02-18 ENCOUNTER — Other Ambulatory Visit: Payer: Self-pay

## 2024-02-21 ENCOUNTER — Other Ambulatory Visit: Payer: Self-pay

## 2024-02-25 ENCOUNTER — Other Ambulatory Visit: Payer: Self-pay

## 2024-02-28 ENCOUNTER — Other Ambulatory Visit: Payer: Self-pay

## 2024-03-01 ENCOUNTER — Other Ambulatory Visit: Payer: Self-pay

## 2024-03-16 ENCOUNTER — Ambulatory Visit (INDEPENDENT_AMBULATORY_CARE_PROVIDER_SITE_OTHER): Payer: Self-pay | Admitting: Nurse Practitioner

## 2024-03-16 ENCOUNTER — Other Ambulatory Visit: Payer: Self-pay

## 2024-03-16 ENCOUNTER — Encounter: Payer: Self-pay | Admitting: Nurse Practitioner

## 2024-03-16 VITALS — BP 128/72 | HR 99 | Temp 98.3°F | Wt 144.0 lb

## 2024-03-16 DIAGNOSIS — Z13220 Encounter for screening for lipoid disorders: Secondary | ICD-10-CM

## 2024-03-16 DIAGNOSIS — E1140 Type 2 diabetes mellitus with diabetic neuropathy, unspecified: Secondary | ICD-10-CM

## 2024-03-16 LAB — POCT GLYCOSYLATED HEMOGLOBIN (HGB A1C): Hemoglobin A1C: 12.5 % — AB (ref 4.0–5.6)

## 2024-03-16 MED ORDER — TRULICITY 3 MG/0.5ML ~~LOC~~ SOAJ
3.0000 mg | SUBCUTANEOUS | 2 refills | Status: DC
  Filled 2024-03-16 – 2024-03-28 (×2): qty 0.5, 7d supply, fill #0

## 2024-03-16 MED ORDER — AMITRIPTYLINE HCL 75 MG PO TABS
ORAL_TABLET | ORAL | 0 refills | Status: DC
  Filled 2024-03-16: qty 67, 37d supply, fill #0

## 2024-03-16 NOTE — Progress Notes (Signed)
 Subjective   Patient ID: Shelley Bartlett, female    DOB: 1966-06-27, 58 y.o.   MRN: [id number]  Chief Complaint  Patient presents with   Follow-up    Referring provider: Jerrlyn Morel, NP  Shelley Bartlett is a 58 y.o. female with Past Medical History: No date: Diabetes (HCC) No date: Goiter   HPI  58 year old female with history of diabetes and Goiter.    Patient presents today for follow-up visit.  Overall she has been doing well.  Patient does have a daughter with her in office today that does speak [place].  A1c in office today is 12.5.  Patient states that she did run out of Trulicity  1 month ago.  She has been lost to follow-up for several months.  We discussed that she does need to follow-up on a 60-month basis.  We will increase Trulicity  to a higher dose and place referral for pharmacy for medication management.  Denies f/c/s, n/v/d, hemoptysis, PND, leg swelling Denies chest pain or edema.    No Known Allergies  Immunization History  Administered Date(s) Administered   Hepatitis A, Adult 09/19/2020   Hepatitis B 07/11/2019   Influenza,inj,Quad PF,6+ Mos 09/19/2020, 10/14/2020, 09/08/2021   MMR 07/11/2019   Td 07/11/2019   Varicella 09/19/2020    Tobacco History: Social History   Tobacco Use  Smoking Status Never  Smokeless Tobacco Never   Counseling given: Not Answered   Outpatient Encounter Medications as of 03/16/2024  Medication Sig   Accu-Chek Softclix Lancets lancets 1 each by Other route in the morning, at noon, in the evening, and at bedtime. Use as instructed   Dulaglutide  (TRULICITY ) 3 MG/0.5ML SOAJ Inject 3 mg as directed once a week.   [DISCONTINUED] Dulaglutide  (TRULICITY ) 1.5 MG/0.5ML SOAJ Inject 1.5 mg into the skin once a week.   amitriptyline  (ELAVIL ) 75 MG tablet Take 1 tablet (75 mg total) by mouth at bedtime for 7 days, THEN 2 tablets (150 mg total) at bedtime.   aspirin -acetaminophen -caffeine  (EXCEDRIN  MIGRAINE)  250-250-65 MG tablet Take 2 tablets by mouth every 8 (eight) hours as needed for headache.   Diclofenac  Sodium (PENNSAID ) 2 % SOLN Apply 1 application topically 4 (four) times daily.   glucose blood (ACCU-CHEK GUIDE) test strip Use four times daily to check blood sugars.   ibuprofen  (ADVIL ) 600 MG tablet Take 1 tablet (600 mg total) by mouth every 8 (eight) hours as needed.   [DISCONTINUED] amitriptyline  (ELAVIL ) 75 MG tablet Take 1 tablet (75 mg total) by mouth at bedtime for 7 days, THEN 2 tablets (150 mg total) at bedtime.   No facility-administered encounter medications on file as of 03/16/2024.    Review of Systems  Review of Systems  Constitutional: Negative.   HENT: Negative.    Cardiovascular: Negative.   Gastrointestinal: Negative.   Allergic/Immunologic: Negative.   Neurological: Negative.   Psychiatric/Behavioral: Negative.       Objective:   BP 128/72   Pulse 99   Temp 98.3 F (36.8 C) (Oral)   Wt 144 lb (65.3 kg)   SpO2 96%   BMI 28.60 kg/m   Wt Readings from Last 5 Encounters:  03/16/24 144 lb (65.3 kg)  11/16/22 144 lb (65.3 kg)  08/03/22 139 lb (63 kg)  01/29/22 140 lb (63.5 kg)  09/08/21 135 lb 9.6 oz (61.5 kg)     Physical Exam Vitals and nursing note reviewed.  Constitutional:      General: She is not in acute distress.  Appearance: She is well-developed.  Cardiovascular:     Rate and Rhythm: Normal rate and regular rhythm.  Pulmonary:     Effort: Pulmonary effort is normal.     Breath sounds: Normal breath sounds.  Neurological:     Mental Status: She is alert and oriented to person, place, and time.       Assessment & Plan:   Lipid screening -     Lipid panel  Type 2 diabetes mellitus with diabetic neuropathy, without long-term current use of insulin (HCC) -     Microalbumin / creatinine urine ratio -     POCT glycosylated hemoglobin (Hb A1C) -     Cologuard -     AMB Referral VBCI Care Management -     CBC -     Comprehensive  metabolic panel with GFR  Other orders -     Trulicity ; Inject 3 mg as directed once a week.  Dispense: 0.5 mL; Refill: 2 -     Amitriptyline  HCl; Take 1 tablet (75 mg total) by mouth at bedtime for 7 days, THEN 2 tablets (150 mg total) at bedtime.  Dispense: 727 tablet; Refill: 0     Return in about 3 months (around 06/16/2024).   Jerrlyn Morel, NP 03/16/2024

## 2024-03-16 NOTE — Patient Instructions (Signed)

## 2024-03-17 ENCOUNTER — Other Ambulatory Visit: Payer: Self-pay | Admitting: Nurse Practitioner

## 2024-03-17 ENCOUNTER — Ambulatory Visit: Payer: Self-pay | Admitting: Nurse Practitioner

## 2024-03-17 DIAGNOSIS — N289 Disorder of kidney and ureter, unspecified: Secondary | ICD-10-CM

## 2024-03-17 LAB — COMPREHENSIVE METABOLIC PANEL WITH GFR
ALT: 17 IU/L (ref 0–32)
AST: 15 IU/L (ref 0–40)
Albumin: 4.1 g/dL (ref 3.8–4.9)
Alkaline Phosphatase: 89 IU/L (ref 44–121)
BUN/Creatinine Ratio: 16 (ref 9–23)
BUN: 11 mg/dL (ref 6–24)
Bilirubin Total: 0.2 mg/dL (ref 0.0–1.2)
CO2: 20 mmol/L (ref 20–29)
Calcium: 9.5 mg/dL (ref 8.7–10.2)
Chloride: 99 mmol/L (ref 96–106)
Creatinine, Ser: 0.68 mg/dL (ref 0.57–1.00)
Globulin, Total: 2.5 g/dL (ref 1.5–4.5)
Glucose: 305 mg/dL — ABNORMAL HIGH (ref 70–99)
Potassium: 4.6 mmol/L (ref 3.5–5.2)
Sodium: 136 mmol/L (ref 134–144)
Total Protein: 6.6 g/dL (ref 6.0–8.5)
eGFR: 102 mL/min/{1.73_m2} (ref >59)

## 2024-03-17 LAB — LIPID PANEL
Chol/HDL Ratio: 4.7 ratio — ABNORMAL HIGH (ref 0.0–4.4)
Cholesterol, Total: 186 mg/dL (ref 100–199)
HDL: 40 mg/dL (ref >39)
LDL Chol Calc (NIH): 102 mg/dL — ABNORMAL HIGH (ref 0–99)
Triglycerides: 256 mg/dL — ABNORMAL HIGH (ref 0–149)
VLDL Cholesterol Cal: 44 mg/dL — ABNORMAL HIGH (ref 5–40)

## 2024-03-17 LAB — MICROALBUMIN / CREATININE URINE RATIO
Creatinine, Urine: 82.5 mg/dL
Microalb/Creat Ratio: 221 mg/g{creat} — ABNORMAL HIGH (ref 0–29)
Microalbumin, Urine: 182.6 ug/mL

## 2024-03-17 LAB — CBC
Hematocrit: 42.3 % (ref 34.0–46.6)
Hemoglobin: 13.4 g/dL (ref 11.1–15.9)
MCH: 28.2 pg (ref 26.6–33.0)
MCHC: 31.7 g/dL (ref 31.5–35.7)
MCV: 89 fL (ref 79–97)
Platelets: 177 10*3/uL (ref 150–450)
RBC: 4.76 x10E6/uL (ref 3.77–5.28)
RDW: 13.4 % (ref 11.7–15.4)
WBC: 5.4 10*3/uL (ref 3.4–10.8)

## 2024-03-20 ENCOUNTER — Telehealth: Payer: Self-pay | Admitting: *Deleted

## 2024-03-21 NOTE — Progress Notes (Unsigned)
 Care Guide Pharmacy Note  03/21/2024 Name: Shelley Bartlett MRN: [id number] DOB: [birth year]  Referred By: Jerrlyn Morel, NP Reason for referral: No chief complaint on file.   Shelley Bartlett is a 58 y.o. year old female who is a primary care patient of Jerrlyn Morel, NP.  Shelley Bartlett was referred to the pharmacist for assistance related to: [facility] Used [employer] ID # [phone] named Shelley Bartlett   An unsuccessful telephone outreach was attempted today to contact the patient who was referred to the pharmacy team for assistance with medication management. Additional attempts will be made to contact the patient.  Shelley Bartlett  [facility]  [facility], [facility] Guide  Direct Dial: [phone]  Fax [phone]

## 2024-03-23 ENCOUNTER — Other Ambulatory Visit: Payer: Self-pay

## 2024-03-28 ENCOUNTER — Other Ambulatory Visit: Payer: Self-pay

## 2024-03-28 ENCOUNTER — Other Ambulatory Visit: Payer: Self-pay | Admitting: Nurse Practitioner

## 2024-03-28 MED ORDER — TRULICITY 3 MG/0.5ML ~~LOC~~ SOAJ
3.0000 mg | SUBCUTANEOUS | 2 refills | Status: AC
  Filled 2024-03-28: qty 2, 28d supply, fill #0
  Filled 2024-05-02 (×2): qty 2, 28d supply, fill #1
  Filled 2024-06-02: qty 2, 28d supply, fill #2
  Filled 2024-06-30: qty 2, 28d supply, fill #3
  Filled 2024-08-04: qty 2, 28d supply, fill #4
  Filled 2024-09-01: qty 2, 28d supply, fill #5
  Filled 2024-09-29: qty 2, 28d supply, fill #6
  Filled 2024-11-03 – 2024-11-16 (×2): qty 2, 28d supply, fill #7

## 2024-03-28 NOTE — Progress Notes (Signed)
 Care Guide Pharmacy Note  03/28/2024 Name: Shelley Bartlett MRN: [id number] DOB: 11/18/1965  Referred By: Jerrlyn Morel, NP Reason for referral: Complex Care Management (Initial outreach to schedule referral in office with PharmD )   Shelley Bartlett is a 58 y.o. year old female who is a primary care patient of Jerrlyn Morel, NP.  Shelley Bartlett was referred to the pharmacist for assistance related to: DMII  Successful contact was made with the patient to discuss pharmacy services including being ready for the pharmacist to call at least 5 minutes before the scheduled appointment time and to have medication bottles and any blood pressure readings ready for review. The patient agreed to meet with the pharmacist via In office 6/11 at 9:00 AM  on (date/time).  Shelley Bartlett was used for the call ID# [id number] named Shelley Bartlett [facility]  [facility], [facility] Guide  Direct Dial: [phone]  Fax [phone]

## 2024-03-30 ENCOUNTER — Other Ambulatory Visit: Payer: Self-pay

## 2024-03-31 ENCOUNTER — Telehealth: Payer: Self-pay

## 2024-03-31 NOTE — Telephone Encounter (Signed)
 Copied from CRM [id number]. Topic: Clinical - Request for Lab/Test Order >> Mar 31, 2024 10:53 AM Everette C wrote: Reason for CRM: Kyla with [employer] has called to share that based on the ICD-10 provided to the patient's insurance provider, service has been denied for Cologuard screening   Kyla would like to please know of alternative recommendations for coding from the patent's PCP when possible  Ref [id number]

## 2024-04-03 ENCOUNTER — Other Ambulatory Visit: Payer: Self-pay

## 2024-04-03 DIAGNOSIS — Z1211 Encounter for screening for malignant neoplasm of colon: Secondary | ICD-10-CM

## 2024-04-07 ENCOUNTER — Other Ambulatory Visit: Payer: Self-pay

## 2024-04-12 ENCOUNTER — Other Ambulatory Visit (HOSPITAL_COMMUNITY): Payer: Self-pay

## 2024-04-12 ENCOUNTER — Other Ambulatory Visit: Payer: Self-pay

## 2024-04-12 ENCOUNTER — Other Ambulatory Visit: Payer: Self-pay | Admitting: Nurse Practitioner

## 2024-04-12 ENCOUNTER — Ambulatory Visit (INDEPENDENT_AMBULATORY_CARE_PROVIDER_SITE_OTHER): Payer: Self-pay

## 2024-04-12 DIAGNOSIS — E1140 Type 2 diabetes mellitus with diabetic neuropathy, unspecified: Secondary | ICD-10-CM

## 2024-04-12 MED ORDER — ACCU-CHEK SOFTCLIX LANCETS MISC
Freq: Two times a day (BID) | 11 refills | Status: AC
  Filled 2024-04-12: qty 100, 50d supply, fill #0

## 2024-04-12 MED ORDER — BLOOD GLUCOSE MONITORING SUPPL DEVI
1.0000 | Freq: Three times a day (TID) | 0 refills | Status: AC
Start: 2024-04-12 — End: ?
  Filled 2024-04-12: qty 1, fill #0

## 2024-04-12 MED ORDER — ACCU-CHEK GUIDE TEST VI STRP
ORAL_STRIP | Freq: Two times a day (BID) | 11 refills | Status: AC
  Filled 2024-04-12: qty 100, 50d supply, fill #0

## 2024-04-12 MED ORDER — INSULIN GLARGINE 100 UNIT/ML SOLOSTAR PEN
10.0000 [IU] | PEN_INJECTOR | Freq: Every day | SUBCUTANEOUS | 5 refills | Status: DC
Start: 2024-04-12 — End: 2024-05-11
  Filled 2024-04-12: qty 6, 30d supply, fill #0
  Filled 2024-05-11: qty 6, 30d supply, fill #1

## 2024-04-12 MED ORDER — PEN NEEDLES 32G X 4 MM MISC
1.0000 | Freq: Every day | 3 refills | Status: AC
  Filled 2024-04-12: qty 100, 34d supply, fill #0
  Filled 2024-07-19: qty 100, 34d supply, fill #1
  Filled 2024-10-23: qty 100, 34d supply, fill #2

## 2024-04-12 NOTE — Patient Instructions (Addendum)
 Ilikuwa nzuri [employer].   Kwa sukari yako ya damuLorain Robson Trulicity  3 mg mara moja kwa wiki ANZA insulini glargine (Lantus) vitengo 10 mara moja kwa siku  Fuatilia sukari yako ya damu ya kufunga mara moja kila siku. Lengo lako la sukari ya damu kabla ya kula ni kati ya 80 na 130 mg/dL. Lengo lako la sukari ya damu baada ya kula ni chini ya 180 mg/dL  Ikiwa una dalili za sukari ya chini ya damu (chini ya 70 mg/dL), ikiwa ni pamoja na kuhisi [place], [place], [place] na jasho - angalia sukari yako ya damu. Ikiwa iko chini, tibu kwa gramu 15 za sukari inayofanya kazi [place] au wanga kama kikombe cha nusu cha juisi au soda.   Sharon December kupunguza ukubwa wa sehemu ya vyakula vya sukari au wanga [address] mlo wako (mkate, mchele, pasta, viazi, mahindi). Raynald Calkins wa vyakula vya protini (nyama, karanga, [place], [place]) kusaidia kusawazisha sukari yako ya damu. Epuka vinywaji vyenye sukari.   Tujulishe ikiwa una maswali au [employer].  __________________________________________________________________  It was great to see you today.   For your blood sugars:  Continue Trulicity  3 mg once weekly START insulin glargine (Lantus) 10 units once daily  Monitor your fasting blood sugar once daily. Your blood sugar goal before eating is between 80 and 130 mg/dL. Your blood sugar goal after meals is less than 180 mg/dL  If you have symptoms of a low blood sugar (less than 70 mg/dL), including feeling dizzy, shaky, sweaty - check your blood sugar. If it is low, treat with 15 grams of fast acting sugars or carbohydrates like a half cup of juice or soda.   Try to limit the portion size of sugary or carbohydrate foods in your diet (bread, rice, pasta, potatoes, corn). Increase intake of protein foods (meat, nuts, fish, dairy) to help level out your blood sugars. Avoid sugary beverages.   Let us  know if you have any questions or concerns.

## 2024-04-12 NOTE — Progress Notes (Signed)
 04/12/2024 Name: Shelley Bartlett MRN: [id number] DOB: Dec 02, 1965  Chief Complaint  Patient presents with   Diabetes   Hyperlipidemia    Shelley Bartlett is a 58 y.o. year old female who was referred for medication management by their primary care provider, Jerrlyn Morel, NP. They presented for a face to face visit today. Patient presents with her son who interprets the visit. They were offered a virtual interpreter but declined.    They were referred to the pharmacist by their PCP for assistance in managing diabetes. PMH includes T2DM, thyroid nodule/goiter, neuropathy, back pain.   Subjective: Patient was last seen by PCP, Abbey Hobby, NP on 03/16/24. A1C in office was 12.5%. Patient ran out of Trulicity  1.5 mg 1 mo prior. She was instructed to restart Trulicity  at 3 mg weekly.   Today - patient reports doing ok. She brings her medications with her, which include amitriptyline  and Trulicity  3 mg. She reports that she has not been keeping Trulicity  in the fridge.    Care Team: Primary Care Provider: Jerrlyn Morel, NP ; Next Scheduled Visit: 06/19/24.  Medication Access/Adherence  Current Pharmacy:  [facility] [address], [address] [place] [address] Phone: [phone] Fax: [phone]   Patient reports affordability concerns with their medications: No  - patient is using [facility] Patient reports access/transportation concerns to their pharmacy: No  Patient reports adherence concerns with their medications:  No     Diabetes:  Current medications: Trulicity  3 mg once weekly (has taken one dose since restarting, 3 remaining in box, Fridays) Medications tried in the past: metformin  XR - GI AE  Denies GI AE including nausea, vomiting, constipation  Current glucose readings: patient brings Accu Chek Guide Me meter for review - testing infrequently (once daily a couple times per week). There are  only two readings from May and June.  04/12/24: 8AM 243 03/28/24: 3PM 275  Patient denies hypoglycemic s/sx including dizziness, shakiness, sweating. Patient reports hyperglycemic symptoms including polydipsia, polyphagia, blurred vision. Denies nocturia, polyuria, neuropathy.   Current meal patterns: Eats 3 times per day. Endorses that she does eat a lot of carbohydrate foods like bread.  - Breakfast: fufu and fruit - Lunch: did not discuss in detail - Supper: did not discuss in detail - Snacks - fruit - Drinks - zero sugar juice,   Current physical activity: did not discuss today  Current medication access support: none - BCBS + Medicaid  Hyperlipidemia/ASCVD Risk Reduction  Current lipid lowering medications: none Medications tried in the past: atorvastatin  - dizziness  Clinical ASCVD: No  The 10-year ASCVD risk score (Arnett DK, et al., 2019) is: 5.2%   Values used to calculate the score:     Age: 38 years     Sex: Female     Is Non-Hispanic African American: No     Diabetic: Yes     Tobacco smoker: No     Systolic Blood Pressure: 128 mmHg     Is BP treated: No     HDL Cholesterol: 40 mg/dL     Total Cholesterol: 186 mg/dL    Objective: BP Readings from Last 3 Encounters:  03/16/24 128/72  11/16/22 116/74  08/03/22 117/85   Lab Results  Component Value Date   HGBA1C 12.5 (A) 03/16/2024    UACR 03/16/24: 221 mg/g (increased rom 70 mg/g in 2023)  Lab Results  Component Value Date   CREATININE 0.68 03/16/2024  BUN 11 03/16/2024   NA 136 03/16/2024   K 4.6 03/16/2024   CL 99 03/16/2024   CO2 20 03/16/2024    Lab Results  Component Value Date   CHOL 186 03/16/2024   HDL 40 03/16/2024   LDLCALC 102 (H) 03/16/2024   TRIG 256 (H) 03/16/2024   CHOLHDL 4.7 (H) 03/16/2024    Medications Reviewed Today     Reviewed by Adra Alanis, RPH (Pharmacist) on 04/12/24 at 1140  Med List Status: <None>   Medication Order Taking? Sig Documenting Provider Last Dose  Status Informant  Accu-Chek Softclix Lancets lancets [id number]  Use as instructed to check blood sugar twice daily. Paseda, Folashade R, FNP  Active   amitriptyline  (ELAVIL ) 75 MG tablet [id number] Yes Take 1 tablet (75 mg total) by mouth at bedtime for 7 days, THEN 2 tablets (150 mg total) at bedtime. Jerrlyn Morel, NP Taking Active   Blood Glucose Monitoring Suppl DEVI [id number]  Use as directed in the morning, at noon, and at bedtime. Paseda, Folashade R, FNP  Active     Discontinued 04/12/24 1140 (Patient has not taken in last 30 days)   Dulaglutide  (TRULICITY ) 3 MG/0.5ML SOAJ [id number]  Inject 3 mg as directed once a week. Jerrlyn Morel, NP  Active   glucose blood (ACCU-CHEK GUIDE TEST) test strip [id number] Yes Use as instructed to monitor blood sugar twice daily Paseda, Folashade R, FNP  Active     Discontinued 04/12/24 1140 (Patient has not taken in last 30 days)   insulin glargine (LANTUS) 100 UNIT/ML Solostar Pen [id number] Yes Inject 10 Units into the skin daily. May inject up to 20 units daily if instructed by your provider. Paseda, Folashade R, FNP  Active   Insulin Pen Needle (PEN NEEDLES) 32G X 4 MM MISC [id number] Yes Use as directed to inject insulin once daily. Paseda, Folashade R, FNP  Active               Assessment/Plan:   Diabetes: - Currently uncontrolled with last A1C of 12.5% uncontrolled above goal < 7%. SMBG data is limited, but both readings in the mid to high 200s. Patient is experiencing s/sx of hyperglycemia. She only has restarted Trulicity  this past Friday. Expect glycemic control will continue to improve, however A1C was still 9.4% when patient was taking Trulicity  1.5 mg weekly. Therefore, do not anticipate that this medication alone will get her to goal. She did not tolerate metformin  in the past, and her A1C is too high to initiate an SGLT2i. Therefore, discussed initiation of basal insulin today with patient and her son. They are amenable to the plan.  -  Reviewed long term cardiovascular and renal outcomes of uncontrolled blood sugar - Reviewed goal A1c, goal fasting, and goal 2 hour post prandial glucose - Reviewed dietary modifications including reducing portion size of carbohydrate foods and eating protein with each meal.Also discussed increasing water intake and minimizing sugary beverages. Patient and son were provided with planning healthy meals handout.  - Recommend to continue Trulicity  3 mg weekly - Recommend to start basal insulin at 10 units once daily in the morning. Demonstrated administration and storage. Reviewed hypoglycemia management. Patient expresses understanding via teach-back method.  - Recommend to check glucose once daily fasting and occasional 2-hr PPG. Discussed importance of daily monitoring with starting insulin.    Hyperlipidemia/ASCVD Risk Reduction: - Currently uncontrolled with last LDL-C of 102 mg/dL above goal < 70 mg/dL given A4ZY. Moderate intensity statin indicated. Did not  have time to discuss trial of different statin today. Will address at follow-up.    Follow Up Plan: Pharmacist in-person 05/23/24, PCP 06/19/24   Arthea Larsson, PharmD PGY1 Pharmacy Resident

## 2024-04-13 ENCOUNTER — Other Ambulatory Visit: Payer: Self-pay

## 2024-04-14 ENCOUNTER — Other Ambulatory Visit: Payer: Self-pay

## 2024-04-20 ENCOUNTER — Telehealth: Payer: Self-pay

## 2024-04-20 ENCOUNTER — Other Ambulatory Visit (HOSPITAL_COMMUNITY): Payer: Self-pay

## 2024-04-20 NOTE — Telephone Encounter (Signed)
 Attempted to outreach patient to follow-up on insulin  initiation and administration. Attempted both listed phone numbers and was not able to reach the patient or leave a voice mail. Follow-up is scheduled in person at [facility] July.   Appears that insulin  and Accu Chek test strips were dispensed from the pharmacy.  Arthea Larsson, PharmD PGY1 Pharmacy Resident

## 2024-05-01 ENCOUNTER — Telehealth: Payer: Self-pay

## 2024-05-01 NOTE — Telephone Encounter (Unsigned)
 Copied from CRM [id number]. Topic: General - Other >> May 01, 2024  1:49 PM Emylou G wrote: Reason for CRM: Son called.. adv insulin  glargine (LANTUS ) 100 UNIT/ML Solostar Pen took medication yesterday at 8pm in the evening and wants to know when it is safe to take the injection today?  Son: Toribio Ahr [phone]

## 2024-05-02 ENCOUNTER — Other Ambulatory Visit: Payer: Self-pay

## 2024-05-03 ENCOUNTER — Other Ambulatory Visit: Payer: Self-pay

## 2024-05-08 ENCOUNTER — Telehealth: Payer: Self-pay

## 2024-05-08 DIAGNOSIS — E1140 Type 2 diabetes mellitus with diabetic neuropathy, unspecified: Secondary | ICD-10-CM

## 2024-05-08 NOTE — Telephone Encounter (Signed)
 Copied from CRM [id number]. Topic: Clinical - Medical Advice >> May 08, 2024  2:25 PM Wess RAMAN wrote: Reason for CRM: Patient forgot to use insulin  glargine (LANTUS ) 100 UNIT/ML Solostar Pen for 2 days. Son, Toribio would like to know if she could take it in the morning instead. He also stated they called a week ago and did not receive a call. Read the notes from Paseda, Folashade R, FNP. He also stated that today makes the 3rd day of no insulin   Callback #: [phone]

## 2024-05-11 ENCOUNTER — Other Ambulatory Visit: Payer: Self-pay

## 2024-05-11 MED ORDER — INSULIN GLARGINE 100 UNIT/ML SOLOSTAR PEN: 10.0000 [IU] | PEN_INJECTOR | Freq: Every day | SUBCUTANEOUS | 5 refills | Status: DC

## 2024-05-11 NOTE — Telephone Encounter (Signed)
 Pt was advised [facility]

## 2024-05-12 ENCOUNTER — Other Ambulatory Visit: Payer: Self-pay

## 2024-05-19 ENCOUNTER — Other Ambulatory Visit: Payer: Self-pay

## 2024-05-23 ENCOUNTER — Other Ambulatory Visit: Payer: Self-pay

## 2024-05-23 ENCOUNTER — Ambulatory Visit (INDEPENDENT_AMBULATORY_CARE_PROVIDER_SITE_OTHER): Payer: Self-pay

## 2024-05-23 DIAGNOSIS — E1140 Type 2 diabetes mellitus with diabetic neuropathy, unspecified: Secondary | ICD-10-CM | POA: Diagnosis not present

## 2024-05-23 MED ORDER — AMITRIPTYLINE HCL 75 MG PO TABS
75.0000 mg | ORAL_TABLET | Freq: Every day | ORAL | 3 refills | Status: DC
  Filled 2024-05-23: qty 90, 90d supply, fill #0
  Filled 2024-08-16 – 2024-08-17 (×2): qty 90, 90d supply, fill #1

## 2024-05-23 MED ORDER — POLYETHYLENE GLYCOL 3350 17 GM/SCOOP PO POWD
17.0000 g | ORAL | 11 refills | Status: AC
  Filled 2024-05-23: qty 238, 28d supply, fill #0

## 2024-05-23 MED ORDER — INSULIN GLARGINE 100 UNIT/ML SOLOSTAR PEN
12.0000 [IU] | PEN_INJECTOR | Freq: Every day | SUBCUTANEOUS | 5 refills | Status: DC
  Filled 2024-05-23: qty 6, 50d supply, fill #0

## 2024-05-23 NOTE — Progress Notes (Signed)
 05/23/2024 Name: Shelley Bartlett MRN: [id number] DOB: Feb 28, 1966  Chief Complaint  Patient presents with   Diabetes    Shelley Bartlett is a 58 y.o. year old female who was referred for medication management by their primary care provider, [name], NP. They presented for a face to face visit today. Patient is accompanied by a [facility] interpreter.    They were referred to the pharmacist by their PCP for assistance in managing diabetes. PMH includes T2DM, thyroid nodule/goiter, neuropathy, back pain.   Subjective: Patient was last seen by PCP, [name], NP on 03/16/24. A1C in office was 12.5%. Patient ran out of Trulicity  1.5 mg 1 mo prior. She was instructed to restart Trulicity  at 3 mg weekly. At last pharmacy appt in-person on 04/20/24, patient reported doing ok. She brought her medications with her (amitriptyline  and Trulicity  3 mg). She revealed that she had not been keeping Trulicity  in the fridge. She was instructed to start basal insulin  at 10 units daily and start monitoring her BG.   Today, patient reports doing ok. She brings her BG log with her. She has been taking her Lantus  and Trulicity  as prescribed. She ran out of amitriptyline  last night and needs a refill.   Care Team: Primary Care Provider: [name], NP ; Next Scheduled Visit: 06/19/24.  Medication Access/Adherence  Current Pharmacy:  [facility] [address], [address] [place] [address] Phone: [phone] Fax: [phone]   Patient reports affordability concerns with their medications: No  - patient is using [facility] Patient reports access/transportation concerns to their pharmacy: No  Patient reports adherence concerns with their medications:  No     Diabetes:  Current medications: Trulicity  3 mg once weekly, Lantus  10 units once daily (son reports that they have missed ~1x dose, and have not consistently been  taking exactly 24 hours apart) Medications tried in the past: metformin  XR - GI AE  Denies GI AE including nausea, vomiting. She does report bothersome constipation. She has not tried anything. She is willing to try Miralax .   Current glucose readings: patient brings written log review. She is testing once daily fasting. Reports this is ~1PM (before her first meal of the day). After this she usually will have juice (which she reports is zero sugar) or milk.  03/28/24: 275 mg/dL 3/83/74: 744 mg/dL 3/81/74: 706 mg/dL 3/80/74: 758 mg/dL 3/79/74: 804 mg/dL 3/78/74: 789 mg/dL 3/77/74: 805 mg/dL 3/76/74: 815 mg/dL 3/75/74: 809 mg/dL 3/74/74: 810 mg/dL 3/73/74: 821 mg/dL 3/72/74: 801 mg/dL  ---------: 817 mg/dL  ---------: 839 mg/dL  ---------: 822 mg/dL  ---------: 833 mg/dL  ---------: 832 mg/dL  ---------: 819 mg/dL  ---------: 814 mg/dL  ---------: 822 mg/dL  ---------: 819 mg/dL  ---------: 807 mg/dL  ---------: 839 mg/dL  ---------: 849 mg/dL  ---------: 849 mg/dL  ---------: 837 mg/dL    Patient denies hypoglycemic s/sx including dizziness, shakiness, sweating. Patient denies hyperglycemic symptoms including polydipsia, polyphagia, blurred vision. Reports stable neuropathy (which is treated with amitriptyline )  Current meal patterns: Eats 3 times per day. Endorses that she does eat a lot of carbohydrate foods like bread. She has not attempted to cut back on fufu since our last discussion.  - Breakfast: fufu and fruit - Lunch: did not discuss in detail - Supper: did not discuss in detail - Snacks - fruit - Drinks - zero sugar juice (passion juice - reports this is zero sugar, but she  will check the label),   Current physical activity: Reports that she walks  Current medication access support: none - BCBS + Medicaid  Hyperlipidemia/ASCVD Risk Reduction  Current lipid lowering medications: none Medications tried in the past: atorvastatin  -  dizziness  Clinical ASCVD: No  The 10-year ASCVD risk score (Arnett DK, et al., 2019) is: 5.2%   Values used to calculate the score:     Age: 76 years     Clincally relevant sex: Female     Is Non-Hispanic African American: No     Diabetic: Yes     Tobacco smoker: No     Systolic Blood Pressure: 128 mmHg     Is BP treated: No     HDL Cholesterol: 40 mg/dL     Total Cholesterol: 186 mg/dL    Objective: BP Readings from Last 3 Encounters:  05/23/24 128/72  03/16/24 128/72  11/16/22 116/74   Lab Results  Component Value Date   HGBA1C 12.5 (A) 03/16/2024    UACR 03/16/24: 221 mg/g (increased rom 70 mg/g in 2023)  Lab Results  Component Value Date   CREATININE 0.68 03/16/2024   BUN 11 03/16/2024   NA 136 03/16/2024   K 4.6 03/16/2024   CL 99 03/16/2024   CO2 20 03/16/2024    Lab Results  Component Value Date   CHOL 186 03/16/2024   HDL 40 03/16/2024   LDLCALC 102 (H) 03/16/2024   TRIG 256 (H) 03/16/2024   CHOLHDL 4.7 (H) 03/16/2024    Medications Reviewed Today     Reviewed by Brinda Lorain SQUIBB, RPH (Pharmacist) on 05/23/24 at 1029  Med List Status: <None>   Medication Order Taking? Sig Documenting Provider Last Dose Status Informant  Accu-Chek Softclix Lancets lancets [id number]  Use as instructed to check blood sugar twice daily. Paseda, Folashade R, FNP  Active   amitriptyline  (ELAVIL ) 75 MG tablet [id number] Yes Take 1 tablet (75 mg total) by mouth at bedtime. Paseda, Folashade R, FNP  Active   Blood Glucose Monitoring Suppl DEVI [id number]  Use as directed in the morning, at noon, and at bedtime. Paseda, Folashade R, FNP  Active   Dulaglutide  (TRULICITY ) 3 MG/0.5ML EMMANUEL [id number] Yes Inject 3 mg as directed once a week. [name], NP  Active   glucose blood (ACCU-CHEK GUIDE TEST) test strip [id number]  Use as instructed to monitor blood sugar twice daily Paseda, Folashade R, FNP  Active   insulin  glargine (LANTUS ) 100 UNIT/ML Solostar Pen [id number] Yes Inject 12  Units into the skin daily. May inject up to 20 units daily if instructed by your provider. [name], NP  Active   Insulin  Pen Needle (PEN NEEDLES) 32G X 4 MM MISC [id number]  Use as directed to inject insulin  once daily. Paseda, Folashade R, FNP  Active   polyethylene glycol powder (MIRALAX ) 17 GM/SCOOP powder [id number] Yes Take 17 g by mouth every other day. May increase to once daily if needed. [name], NP  Active               Assessment/Plan:   Diabetes: - Currently uncontrolled with last A1C of 12.5% uncontrolled above goal < 7%, but improving since restarting insulin  and Trulicity  per SMBG data. Discussed that patient would be a good candidate for SGLT2i, now that her glycemic control has improved. However, patient would prefer to titrate insulin  rather than start an oral medication. She does have alternative indication for SGLT2i with last UACR of 221 mg/g - so  will plan to re-discuss in the future. - Reviewed long term cardiovascular and renal outcomes of uncontrolled blood sugar - Reviewed hypoglycemia management and the rule of 15. - Reviewed goal A1c, goal fasting, and goal 2 hour post prandial glucose - Reviewed dietary modifications including reducing portion size of carbohydrate foods and eating protein with each meal.Also discussed increasing water intake and minimizing sugary beverages. Patient and son were provided with planning healthy meals handout.  - Recommend to continue Trulicity  3 mg weekly - Recommend to increase basal insulin  to 12 units once daily. Re-educated to keep doses ~24 hours apart at a time of day that is convenient for her. - Recommend to check glucose once daily fasting and occasional 2-hr PPG. Discussed importance of daily monitoring with starting insulin .    Hyperlipidemia/ASCVD Risk Reduction: - Currently uncontrolled with last LDL-C of 102 mg/dL above goal < 70 mg/dL given U7IF. Moderate intensity statin indicated. Patient is  hesitant to take additional oral medications. 10-year ASCVD risk score is low, so ok to hold off for now - but will plan to re-discuss importance of ASCVD prevention in the future.    Collaborated with provider in clinic to refill amitriptyline  for sleep and neuropathy.    Follow Up Plan:  PCP 06/19/24, PharmD in-person 07/19/24   Lorain Baseman, PharmD [facility] [phone]

## 2024-05-23 NOTE — Patient Instructions (Signed)
 It was nice to see you today!  Your goal blood sugar is 80-130 before eating and less than 180 after eating.  Medication Changes:  Increase Lantus  to 12 units daily  Continue Trulicity  3 mg once weekly  We will refill your amitriptyline  for sleep  Continue all other medication the same.   Monitor blood sugars at home and keep a log (glucometer or piece of paper) to bring with you to your next visit.  Keep up the good work with diet and exercise. Aim for a diet full of vegetables, fruit and lean meats (chicken, mal[place], fish). Try to limit salt intake by eating fresh or frozen vegetables (instead of canned), rinse canned vegetables prior to cooking and do not add any additional salt to meals.

## 2024-05-24 ENCOUNTER — Other Ambulatory Visit: Payer: Self-pay

## 2024-06-02 ENCOUNTER — Other Ambulatory Visit: Payer: Self-pay

## 2024-06-19 ENCOUNTER — Ambulatory Visit: Payer: Self-pay | Admitting: Nurse Practitioner

## 2024-06-30 ENCOUNTER — Other Ambulatory Visit: Payer: Self-pay

## 2024-07-14 ENCOUNTER — Ambulatory Visit (INDEPENDENT_AMBULATORY_CARE_PROVIDER_SITE_OTHER): Payer: Self-pay | Admitting: Nurse Practitioner

## 2024-07-14 ENCOUNTER — Encounter: Payer: Self-pay | Admitting: Nurse Practitioner

## 2024-07-14 ENCOUNTER — Other Ambulatory Visit: Payer: Self-pay

## 2024-07-14 VITALS — BP 139/83 | HR 96 | Ht 59.5 in | Wt 143.0 lb

## 2024-07-14 DIAGNOSIS — E1140 Type 2 diabetes mellitus with diabetic neuropathy, unspecified: Secondary | ICD-10-CM

## 2024-07-14 LAB — POCT GLYCOSYLATED HEMOGLOBIN (HGB A1C): Hemoglobin A1C: 8.2 % — AB (ref 4.0–5.6)

## 2024-07-14 MED ORDER — TIZANIDINE HCL 4 MG PO TABS
4.0000 mg | ORAL_TABLET | Freq: Three times a day (TID) | ORAL | 0 refills | Status: DC | PRN
Start: 2024-07-14 — End: 2024-08-16
  Filled 2024-07-14: qty 30, 10d supply, fill #0

## 2024-07-14 MED ORDER — INSULIN GLARGINE 100 UNIT/ML SOLOSTAR PEN
12.0000 [IU] | PEN_INJECTOR | Freq: Every day | SUBCUTANEOUS | 5 refills | Status: DC
  Filled 2024-07-14: qty 6, 30d supply, fill #0
  Filled 2024-08-14: qty 6, 30d supply, fill #1
  Filled 2024-10-10: qty 6, 30d supply, fill #2
  Filled 2024-12-05: qty 6, 30d supply, fill #3

## 2024-07-14 NOTE — Patient Instructions (Signed)
 Diabte : Comptage des glucides pour les adultes Diabetes: Carbohydrate Counting for Adults Le calcul des glucides est une mthode qui permet de Electronics engineer la quantit de glucides que vous ingrez. La consommation de glucides augmente la quantit de sucre, galement appel  glucose , [employer] sang. En comptant la quantit de glucides que vous ingrez, vous pourrez mieux grer votre sucre sanguin. Cela vous aidera ainsi  mieux grer votre diabte. Les glucides sont mesurs en grammes (g) par portion. Il est important de connatre la quantit de glucides (en grammes ou par taille de portion) que vous pouvez consommer lors de chaque repas. Celle-ci diffre [employer]. Un ditticien Chemical engineer  laborer un plan alimentaire et  calculer la quantit de glucides que vous devriez consommer  chaque repas et collation. Quels aliments contiennent des glucides ? Les aliments contenant des glucides sont : Les crales, comme le pain. Les haricots secs et [employer]  base de soja. Les fculents, comme les pommes de terre, les pois et le mas. Les fruits et les jus de fruits. Le lait et les yaourts. Les sucreries et les aliments  grignoter, comme les gteaux, biscuits, bonbons, chips et [employer]. Comment calculer les glucides contenus dans les aliments ? Il existe deux faons de Heritage manager teneur en glucides des aliments. Vous pouvez consulter les tiquettes des [employer] ou apprendre  mesurer la taille des portions standard des aliments. Vous pouvez utiliser l'une ou l'autre de ces mthodes, ou bien vous servir des deux ensemble. Utiliser l'tiquette des [employer] nutritionnelles Le tableau des valeurs nutritionnelles figure sur les tiquettes de la plupart des aliments et boissons conditionns aux [place]. Il comprend : Yahoo portions. Des informations sur les nutriments contenus dans chaque portion. Les grammes de glucides par portion [place],  [place], indiqus. Pour pouvoir vous servir des valeurs nutritionnelles, vous devez d'abord dcider du nombre de portions que vous Scientist, clinical (histocompatibility and immunogenetics). Multipliez PACC[employer] Inc de portions par la quantit de glucides par portion. Le rsultat est la quantit totale de glucides, en grammes, que vous allez consommer. Apprendre  mesurer la State [employer] Corporation portions standard des aliments Lorsque vous consommez des aliments contenant des glucides qui ne sont pas conditionns ou pour lesquels les valeurs nutritionnelles ne figurent pas sur l'tiquette, vous devez mesurer les portions afin de calculer les grammes de glucides. Mesurez les aliments que vous Teaching laboratory technician consommer  l'aide d'une balance de cuisine ou d'un gobelet doseur, si [place]. Dcidez du [place] de portions de taille standard que vous Scientist, clinical (histocompatibility and immunogenetics). Multipliez le nombre de portions par 15. Pour les aliments contenant des glucides, une portion quivaut  15 g de glucides. Par exemple, si vous mangez 2 tasses ou 300 g (10 oz) de fraises, vous aurez consomm 2 portions et 30 g de glucides (2 portions x 15 g = 30 g). Pour les repas qui contiennent plus d'un aliment, comme les soupes et les plats mijots, vous devez Ecologist nombre de glucides contenus dans chacun des aliments utiliss. La liste suivante indique la taille des portions standard des aliments riches en glucides les plus frquents. Chacune de ces portions contient environ 15 g de glucides : 1 tranche de pain. 1 tortilla de 15 cm (6 po). ? tasse ou 53 g (2 oz) de ptes ou de riz cuits.  tasse ou 85 g (3 oz) de haricots ou de lentilles goutts et rincs, cuits ou en conserve.  tasse ou 85 g (3 oz) de fculents, par exemple, pois,  mas ou pommes de terre.  tasse ou 120 g (4 oz) de crales chaudes.  tasse ou 85 g (3 oz) de pommes de terre bouillies ou en pure, ou  ou 85 g (3 oz) d'une grande pomme de terre cuite au four.  tasse ou 118 ml (4 fl oz) de jus de fruits. 1 tasse ou  237 ml (8 fl oz) de lait. 1 petite pomme ou 106 g (4 oz).  banane moyenne ou 63 g (2 oz). 1 tasse ou 150 g (5 oz) de fraises. 3 tasses ou 28,3 g (1 oz) de mas souffl. Comment faire un calcul de glucides ? Pour calculer les grammes de glucides dans un repas type, suivez les tapes indiques ci-dessous. Exemple de repas 85 g (3 oz) de blanc de poulet. ? tasse ou 106 g (4 oz) de riz brun.  tasse ou 85 g (3 oz) de mas. 1 tasse ou 237 ml (8 fl oz) de lait. 1 tasse ou 150 g (5 oz) de fraises avec [place] sans sucre. Calcul des glucides Identifiez les aliments qui contiennent des glucides : Riz. Mas. Lait. Fraises. Calculez le [place] de portions de chaque aliment que vous consommez : 2 portions de riz. 1 portion de mas. 1 portion de lait. 1 portion de fraises. Multipliez le nombre de portions par 15 g : 2 portions de riz x 15 g = 30 g. 1 portion de mas x 15 g = 15 g. 1 portion de lait x 15 g = 15 g. 1 portion de fraises x 15 g = 15 g. Additionnez toutes les quantits pour Le[employer] total en grammes des glucides consomms : 30 g + 15 g + 15 g + 15 g = 75 g de glucides au total. Pour obtenir plus d'informations Pour en savoir plus, consultez : L'American Diabetes Association (Association amricaine du diabte) sur diabetes.durel Poke sur  Search  Designer, television/film set) et saisissez  carb counting  (comptage des glucides). Slectionnez le lien que vous Radio producer. Centers for Disease Control and Prevention (Centres pour le contrle et la prvention des maladies) sur TonerPromos.no. Cliquez sur  Pharmacist, hospital) et saisissez  diabetes  (diabte). Slectionnez le lien que vous Radio producer. Academy of Nutrition and Dietetics (Acadmie de nutrition et de dittique) : eatright.org Ces conseils et renseignements ne sauraient se substituer  l'avis mdical de votre prestataire de soins de sant. Par consquent, il est primordial de parler de toutes vos proccupations avec votre  prestataire de soins de sant. Document Revised: 12/16/2023 Document Reviewed: 12/16/2023 Elsevier Patient Education  2025 [employer].

## 2024-07-14 NOTE — Progress Notes (Signed)
 Subjective   Patient ID: Shelley Bartlett, female    DOB: 03-Dec-1965, 58 y.o.   MRN: [id number]  Chief Complaint  Patient presents with   Medical Management of Chronic Issues    Referring provider: [name], NP  Shelley Bartlett is a 58 y.o. female with Past Medical History: No date: Diabetes (HCC) No date: Goiter   HPI  58 year old female with history of diabetes and Goiter.    Patient presents today for follow-up visit.  Overall she has been doing well.  Patient does have a daughter with her in office today that does speak [place].  A1c in office today is 8.2.  Patient has been followed by pharmacy for medication management since last visit here.  Denies f/c/s, n/v/d, hemoptysis, PND, leg swelling Denies chest pain or edema.    No Known Allergies  Immunization History  Administered Date(s) Administered   Hepatitis A, Adult 09/19/2020   Hepatitis B 07/11/2019   Influenza,inj,Quad PF,6+ Mos 09/19/2020, 10/14/2020, 09/08/2021   MMR 07/11/2019   Td 07/11/2019   Varicella 09/19/2020    Tobacco History: Social History   Tobacco Use  Smoking Status Never  Smokeless Tobacco Never   Counseling given: Not Answered   Outpatient Encounter Medications as of 07/14/2024  Medication Sig   Accu-Chek Softclix Lancets lancets Use as instructed to check blood sugar twice daily.   amitriptyline  (ELAVIL ) 75 MG tablet Take 1 tablet (75 mg total) by mouth at bedtime.   Blood Glucose Monitoring Suppl DEVI Use as directed in the morning, at noon, and at bedtime.   Dulaglutide  (TRULICITY ) 3 MG/0.5ML SOAJ Inject 3 mg as directed once a week.   glucose blood (ACCU-CHEK GUIDE TEST) test strip Use as instructed to monitor blood sugar twice daily   Insulin  Pen Needle (PEN NEEDLES) 32G X 4 MM MISC Use as directed to inject insulin  once daily.   polyethylene glycol powder (MIRALAX ) 17 GM/SCOOP powder Take 17 grams by mouth every other day. May increase to once daily if  needed.   tiZANidine  (ZANAFLEX ) 4 MG tablet Take 1 tablet (4 mg total) by mouth every 8 (eight) hours as needed for muscle spasms.   [DISCONTINUED] insulin  glargine (LANTUS ) 100 UNIT/ML Solostar Pen Inject 12 Units into the skin daily. May inject up to 20 units daily if instructed by your provider.   insulin  glargine (LANTUS ) 100 UNIT/ML Solostar Pen Inject 12 Units into the skin daily. May inject up to 20 units daily if instructed by your provider.   No facility-administered encounter medications on file as of 07/14/2024.    Review of Systems  Review of Systems  Constitutional: Negative.   HENT: Negative.    Cardiovascular: Negative.   Gastrointestinal: Negative.   Allergic/Immunologic: Negative.   Neurological: Negative.   Psychiatric/Behavioral: Negative.       Objective:   BP 139/83   Pulse 96   Ht 4' 11.5 (1.511 m)   Wt 143 lb (64.9 kg)   SpO2 99%   BMI 28.40 kg/m   Wt Readings from Last 5 Encounters:  07/14/24 143 lb (64.9 kg)  05/23/24 141 lb (64 kg)  03/16/24 144 lb (65.3 kg)  11/16/22 144 lb (65.3 kg)  08/03/22 139 lb (63 kg)     Physical Exam Vitals and nursing note reviewed.  Constitutional:      General: She is not in acute distress.    Appearance: She is well-developed.  Cardiovascular:     Rate and Rhythm: Normal rate and regular  rhythm.  Pulmonary:     Effort: Pulmonary effort is normal.     Breath sounds: Normal breath sounds.  Neurological:     Mental Status: She is alert and oriented to person, place, and time.       Assessment & Plan:   Type 2 diabetes mellitus with diabetic neuropathy, without long-term current use of insulin  (HCC) -     POCT glycosylated hemoglobin (Hb A1C) -     Insulin  Glargine; Inject 12 Units into the skin daily. May inject up to 20 units daily if instructed by your provider.  Dispense: 6 mL; Refill: 5  Other orders -     tiZANidine  HCl; Take 1 tablet (4 mg total) by mouth every 8 (eight) hours as needed for  muscle spasms.  Dispense: 30 tablet; Refill: 0     Return in about 3 months (around 10/13/2024).   Bascom GORMAN Borer, NP 07/14/2024

## 2024-07-17 ENCOUNTER — Other Ambulatory Visit: Payer: Self-pay

## 2024-07-19 ENCOUNTER — Ambulatory Visit: Payer: Self-pay

## 2024-07-19 ENCOUNTER — Other Ambulatory Visit: Payer: Self-pay

## 2024-07-19 NOTE — Progress Notes (Deleted)
 07/19/2024 Name: Shelley Bartlett MRN: [id number] DOB: 08-01-1966  No chief complaint on file.   Shelley Bartlett is a 58 y.o. year old female who was referred for medication management by their primary care provider, [name], NP. They presented for a face to face visit today. Patient is accompanied by a [facility] interpreter.    They were referred to the pharmacist by their PCP for assistance in managing diabetes. PMH includes T2DM, thyroid nodule/goiter, neuropathy, back pain.   Subjective: Patient was seen by PCP, [name], NP on 03/16/24. A1C in office was 12.5%. Patient ran out of Trulicity  1.5 mg 1 mo prior. She was instructed to restart Trulicity  at 3 mg weekly. At pharmacy appt in-person on 04/20/24, patient reported doing ok. She brought her medications with her (amitriptyline  and Trulicity  3 mg). She revealed that she had not been keeping Trulicity  in the fridge. She was instructed to start basal insulin  at 10 units daily and start monitoring her BG. She was seen in person by pharmacy for f/u on 05/23/24. She reported taking Trulicity  and insulin  as prescribed. FBG had slowly improved to the 150-160 mg/dL. She was instructed to increase basal insulin  to 12 units once daily. She was seen by PCP on 07/14/24. A1C had improved from 12.5% to 8.2%. No changes were made.  Today, patient reports doing ok. She brings her BG log with her. She has been taking her Lantus  and Trulicity  as prescribed. She ran out of amitriptyline  last night and needs a refill.   Care Team: Primary Care Provider: [name], NP ; Next Scheduled Visit: 06/19/24.  Medication Access/Adherence  Current Pharmacy:  [facility] [address], [address] [place] [address] Phone: [phone] Fax: [phone]   Patient reports affordability concerns with their medications: No  - patient is using [facility] Patient reports  access/transportation concerns to their pharmacy: No  Patient reports adherence concerns with their medications:  No     Diabetes:  Current medications: Trulicity  3 mg once weekly, Lantus  12 units once daily *** Medications tried in the past: metformin  XR - GI AE  Denies GI AE including nausea, vomiting. She does report bothersome constipation. She has not tried anything. She is willing to try Miralax .   Current glucose readings: patient brings written log review. She is testing once daily fasting. Reports this is ~1PM (before her first meal of the day). After this she usually will have juice (which she reports is zero sugar) or milk.  03/28/24: 275 mg/dL 3/83/74: 744 mg/dL 3/81/74: 706 mg/dL 3/80/74: 758 mg/dL 3/79/74: 804 mg/dL 3/78/74: 789 mg/dL 3/77/74: 805 mg/dL 3/76/74: 815 mg/dL 3/75/74: 809 mg/dL 3/74/74: 810 mg/dL 3/73/74: 821 mg/dL 3/72/74: 801 mg/dL  ---------: 817 mg/dL  ---------: 839 mg/dL  ---------: 822 mg/dL  ---------: 833 mg/dL  ---------: 832 mg/dL  ---------: 819 mg/dL  ---------: 814 mg/dL  ---------: 822 mg/dL  ---------: 819 mg/dL  ---------: 807 mg/dL  ---------: 839 mg/dL  ---------: 849 mg/dL  ---------: 849 mg/dL  ---------: 837 mg/dL    Patient denies hypoglycemic s/sx including dizziness, shakiness, sweating. Patient denies hyperglycemic symptoms including polydipsia, polyphagia, blurred vision. Reports stable neuropathy (which is treated with amitriptyline )  Current meal patterns: Eats 3 times per day. Endorses that she does eat a lot of carbohydrate foods like bread. She has not attempted to cut back on fufu since our last discussion.  - Breakfast: fufu and fruit - Lunch:  did not discuss in detail - Supper: did not discuss in detail - Snacks - fruit - Drinks - zero sugar juice (passion juice - reports this is zero sugar, but she will check the label),   Current physical activity: Reports that she  walks  Current medication access support: none - BCBS + Medicaid  Hyperlipidemia/ASCVD Risk Reduction  Current lipid lowering medications: none Medications tried in the past: atorvastatin  - dizziness  Clinical ASCVD: No  The 10-year ASCVD risk score (Arnett DK, et al., 2019) is: 6.1%   Values used to calculate the score:     Age: 38 years     Clincally relevant sex: Female     Is Non-Hispanic African American: No     Diabetic: Yes     Tobacco smoker: No     Systolic Blood Pressure: 139 mmHg     Is BP treated: No     HDL Cholesterol: 40 mg/dL     Total Cholesterol: 186 mg/dL    Objective: BP Readings from Last 3 Encounters:  07/14/24 139/83  05/23/24 128/72  03/16/24 128/72   Lab Results  Component Value Date   HGBA1C 8.2 (A) 07/14/2024    UACR 03/16/24: 221 mg/g (increased rom 70 mg/g in 2023)  Lab Results  Component Value Date   CREATININE 0.68 03/16/2024   BUN 11 03/16/2024   NA 136 03/16/2024   K 4.6 03/16/2024   CL 99 03/16/2024   CO2 20 03/16/2024    Lab Results  Component Value Date   CHOL 186 03/16/2024   HDL 40 03/16/2024   LDLCALC 102 (H) 03/16/2024   TRIG 256 (H) 03/16/2024   CHOLHDL 4.7 (H) 03/16/2024    Medications Reviewed Today   Medications were not reviewed in this encounter       Assessment/Plan:   Diabetes: - Currently uncontrolled with last A1C of 12.5% uncontrolled above goal < 7%, but improving since restarting insulin  and Trulicity  per SMBG data. Discussed that patient would be a good candidate for SGLT2i, now that her glycemic control has improved. However, patient would prefer to titrate insulin  rather than start an oral medication. She does have alternative indication for SGLT2i with last UACR of 221 mg/g - so will plan to re-discuss in the future. - Reviewed long term cardiovascular and renal outcomes of uncontrolled blood sugar - Reviewed hypoglycemia management and the rule of 15. - Reviewed goal A1c, goal fasting, and  goal 2 hour post prandial glucose - Reviewed dietary modifications including reducing portion size of carbohydrate foods and eating protein with each meal.Also discussed increasing water intake and minimizing sugary beverages. Patient and son were provided with planning healthy meals handout.  - Recommend to continue Trulicity  3 mg weekly - Recommend to increase basal insulin  to 12 units once daily. Re-educated to keep doses ~24 hours apart at a time of day that is convenient for her. - Recommend to check glucose once daily fasting and occasional 2-hr PPG. Discussed importance of daily monitoring with starting insulin .    Hyperlipidemia/ASCVD Risk Reduction: - Currently uncontrolled with last LDL-C of 102 mg/dL above goal < 70 mg/dL given U7IF. Moderate intensity statin indicated. Patient is hesitant to take additional oral medications. 10-year ASCVD risk score is low, so ok to hold off for now - but will plan to re-discuss importance of ASCVD prevention in the future.    Collaborated with provider in clinic to refill amitriptyline  for sleep and neuropathy.    Follow Up Plan:  PCP 10/13/24,  PharmD in-person ***   Lorain Baseman, PharmD [facility] [phone]

## 2024-07-20 ENCOUNTER — Telehealth: Payer: Self-pay | Admitting: *Deleted

## 2024-07-20 NOTE — Progress Notes (Signed)
 Complex Care Management Care Guide Note  07/20/2024 Name: Shelley Bartlett MRN: [id number] DOB: 07-08-1966  Shelley Bartlett is a 58 y.o. year old female who is a primary care patient of [name], NP and is actively engaged with the care management team. I reached out to Kelly Wilene Bunk by phone today to assist with re-scheduling  with the Pharmacist.  Follow up plan: Unsuccessful telephone outreach attempt made.   Harlene Satterfield  [facility]  [facility] Guide  Direct Dial: [phone]  Fax [phone]

## 2024-07-26 NOTE — Progress Notes (Signed)
 Complex Care Management Care Guide Note  07/26/2024 Name: Shelley Bartlett MRN: [id number] DOB: [birth year]  Shelley Bartlett is a 58 y.o. year old female who is a primary care patient of [name], NP and is actively engaged with the care management team. I reached out to Shelley Bartlett by phone today to assist with scheduling  with the Pharmacist. Used [employer] ID# [id number] named [email or web address]  Follow up plan: Face to Face appointment with complex care management team member scheduled for:  09/12/24 at 10:00 AM  Harlene [facility]  [facility], [facility] Guide  Direct Dial: [phone]  Fax [phone]

## 2024-08-04 ENCOUNTER — Other Ambulatory Visit: Payer: Self-pay

## 2024-08-14 ENCOUNTER — Other Ambulatory Visit: Payer: Self-pay

## 2024-08-16 ENCOUNTER — Other Ambulatory Visit: Payer: Self-pay

## 2024-08-16 ENCOUNTER — Other Ambulatory Visit: Payer: Self-pay | Admitting: Nurse Practitioner

## 2024-08-16 NOTE — Telephone Encounter (Signed)
 Please advise [facility]

## 2024-08-17 ENCOUNTER — Other Ambulatory Visit: Payer: Self-pay

## 2024-08-17 MED ORDER — TIZANIDINE HCL 4 MG PO TABS
4.0000 mg | ORAL_TABLET | Freq: Three times a day (TID) | ORAL | 0 refills | Status: DC | PRN
  Filled 2024-08-17: qty 30, 10d supply, fill #0

## 2024-08-18 ENCOUNTER — Other Ambulatory Visit: Payer: Self-pay

## 2024-08-23 ENCOUNTER — Other Ambulatory Visit: Payer: Self-pay

## 2024-09-01 ENCOUNTER — Other Ambulatory Visit: Payer: Self-pay

## 2024-09-12 ENCOUNTER — Telehealth: Payer: Self-pay

## 2024-09-12 ENCOUNTER — Ambulatory Visit: Payer: Self-pay

## 2024-09-12 NOTE — Progress Notes (Deleted)
 09/12/2024 Name: Shelley Bartlett MRN: [id number] DOB: 10-30-1966  No chief complaint on file.   Shelley Bartlett is a 58 y.o. year old female who was referred for medication management by their primary care provider, [name], NP. They presented for a face to face visit today. Patient is accompanied by a [facility] interpreter. [id number]   They were referred to the pharmacist by their PCP for assistance in managing diabetes. PMH includes T2DM, thyroid nodule/goiter, neuropathy, back pain.   Subjective: Patient was seen by PCP, [name], NP on 03/16/24. A1C in office was 12.5%. Patient ran out of Trulicity  1.5 mg 1 mo prior. She was instructed to restart Trulicity  at 3 mg weekly. At last pharmacy appt in-person on 04/20/24, patient reported doing ok. She brought her medications with her (amitriptyline  and Trulicity  3 mg). She revealed that she had not been keeping Trulicity  in the fridge. She was instructed to start basal insulin  at 10 units daily and start monitoring her BG. AT pharmacy visit on 05/23/24, BG had started to improve with insulin . She was instructed to increase to 12 units daily. She saw PCP on 07/14/24, A1C improved to 8.2%.   Today, patient reports doing ok. She brings her BG log with her. She has been taking her Lantus  and Trulicity  as prescribed. She ran out of amitriptyline  last night and needs a refill.   Get 3 mo supply of insulin  CGM? Statin?  SGLT2i  Care Team: Primary Care Provider: [name], NP ; Next Scheduled Visit: 10/13/24.  Medication Access/Adherence  Current Pharmacy:  [facility] [address], [address] [place] [address] Phone: [phone] Fax: [phone]   Patient reports affordability concerns with their medications: No  - patient is using [facility] Patient reports access/transportation concerns to their pharmacy: No  Patient reports adherence concerns with  their medications:  No     Diabetes:  Current medications: Trulicity  3 mg once weekly, Lantus  12 units once daily (son reports that they have missed ~1x dose, and have not consistently been taking exactly 24 hours apart) Medications tried in the past: metformin  XR - GI AE  Denies GI AE including nausea, vomiting. She does report bothersome constipation. She has not tried anything. She is willing to try Miralax .   Current glucose readings: patient brings written log review. She is testing once daily fasting. Reports this is ~1PM (before her first meal of the day). After this she usually will have juice (which she reports is zero sugar) or milk.  03/28/24: 275 mg/dL 3/83/74: 744 mg/dL 3/81/74: 706 mg/dL 3/80/74: 758 mg/dL 3/79/74: 804 mg/dL 3/78/74: 789 mg/dL 3/77/74: 805 mg/dL 3/76/74: 815 mg/dL 3/75/74: 809 mg/dL 3/74/74: 810 mg/dL 3/73/74: 821 mg/dL 3/72/74: 801 mg/dL  ---------: 817 mg/dL  ---------: 839 mg/dL  ---------: 822 mg/dL  ---------: 833 mg/dL  ---------: 832 mg/dL  ---------: 819 mg/dL  ---------: 814 mg/dL  ---------: 822 mg/dL  ---------: 819 mg/dL  ---------: 807 mg/dL  ---------: 839 mg/dL  ---------: 849 mg/dL  ---------: 849 mg/dL  ---------: 837 mg/dL    Patient denies hypoglycemic s/sx including dizziness, shakiness, sweating. Patient denies hyperglycemic symptoms including polydipsia, polyphagia, blurred vision. Reports stable neuropathy (which is treated with amitriptyline )  Current meal patterns: Eats 3 times per day. Endorses that she does eat a lot of carbohydrate foods like bread. She has not attempted to cut back on fufu since our last discussion.  - Breakfast: fufu and  fruit - Lunch: did not discuss in detail - Supper: did not discuss in detail - Snacks - fruit - Drinks - zero sugar juice (passion juice - reports this is zero sugar, but she will check the label),   Current physical activity: Reports that she  walks  Current medication access support: none - BCBS + Medicaid  Hyperlipidemia/ASCVD Risk Reduction  Current lipid lowering medications: none Medications tried in the past: atorvastatin  - dizziness  Clinical ASCVD: No  The 10-year ASCVD risk score (Arnett DK, et al., 2019) is: 6.1%   Values used to calculate the score:     Age: 28 years     Clincally relevant sex: Female     Is Non-Hispanic African American: No     Diabetic: Yes     Tobacco smoker: No     Systolic Blood Pressure: 139 mmHg     Is BP treated: No     HDL Cholesterol: 40 mg/dL     Total Cholesterol: 186 mg/dL    Objective: BP Readings from Last 3 Encounters:  07/14/24 139/83  05/23/24 128/72  03/16/24 128/72   Lab Results  Component Value Date   HGBA1C 8.2 (A) 07/14/2024    UACR 03/16/24: 221 mg/g (increased rom 70 mg/g in 2023)  Lab Results  Component Value Date   CREATININE 0.68 03/16/2024   BUN 11 03/16/2024   NA 136 03/16/2024   K 4.6 03/16/2024   CL 99 03/16/2024   CO2 20 03/16/2024    Lab Results  Component Value Date   CHOL 186 03/16/2024   HDL 40 03/16/2024   LDLCALC 102 (H) 03/16/2024   TRIG 256 (H) 03/16/2024   CHOLHDL 4.7 (H) 03/16/2024    Medications Reviewed Today   Medications were not reviewed in this encounter       Assessment/Plan:   Diabetes: - Currently uncontrolled with last A1C of 12.5% uncontrolled above goal < 7%, but improving since restarting insulin  and Trulicity  per SMBG data. Discussed that patient would be a good candidate for SGLT2i, now that her glycemic control has improved. However, patient would prefer to titrate insulin  rather than start an oral medication. She does have alternative indication for SGLT2i with last UACR of 221 mg/g - so will plan to re-discuss in the future. - Reviewed long term cardiovascular and renal outcomes of uncontrolled blood sugar - Reviewed hypoglycemia management and the rule of 15. - Reviewed goal A1c, goal fasting, and  goal 2 hour post prandial glucose - Reviewed dietary modifications including reducing portion size of carbohydrate foods and eating protein with each meal.Also discussed increasing water intake and minimizing sugary beverages. Patient and son were provided with planning healthy meals handout.  - Recommend to continue Trulicity  3 mg weekly - Recommend to increase basal insulin  to 12 units once daily. Re-educated to keep doses ~24 hours apart at a time of day that is convenient for her. - Recommend to check glucose once daily fasting and occasional 2-hr PPG. Discussed importance of daily monitoring with starting insulin .    Hyperlipidemia/ASCVD Risk Reduction: - Currently uncontrolled with last LDL-C of 102 mg/dL above goal < 70 mg/dL given U7IF. Moderate intensity statin indicated. Patient is hesitant to take additional oral medications. 10-year ASCVD risk score is low, so ok to hold off for now - but will plan to re-discuss importance of ASCVD prevention in the future.    Collaborated with provider in clinic to refill amitriptyline  for sleep and neuropathy.    Follow Up Plan:  PCP 06/19/24, PharmD in-person 07/19/24   Lorain Baseman, PharmD [facility] [phone]

## 2024-09-12 NOTE — Telephone Encounter (Signed)
 Attempted to contact patient for scheduled appointment for medication management. Left HIPAA compliant message on daughter's phone number for patient to return my call at their convenience.   Lorain Baseman, PharmD [facility] [phone]

## 2024-09-14 ENCOUNTER — Telehealth: Payer: Self-pay

## 2024-09-14 NOTE — Progress Notes (Signed)
 Complex Care Management Care Guide Note  09/14/2024 Name: Shelley Bartlett MRN: [id number] DOB: [birth year]  [name] is a 58 y.o. year old female who is a primary care patient of [name], NP and is actively engaged with the care management team. I reached out to Kelly Wilene Bunk by phone today to assist with re-scheduling  with the Pharmacist.  Follow up plan: Unsuccessful telephone outreach attempt made. A HIPAA compliant phone message was left for the patient providing contact information and requesting a return call.  Leotis Rase [facility], [facility] Guide  Direct Dial: [phone]  Fax [phone]

## 2024-09-18 ENCOUNTER — Other Ambulatory Visit: Payer: Self-pay | Admitting: Nurse Practitioner

## 2024-09-18 ENCOUNTER — Other Ambulatory Visit: Payer: Self-pay

## 2024-09-19 NOTE — Telephone Encounter (Signed)
 Please advise [facility]

## 2024-09-20 ENCOUNTER — Other Ambulatory Visit: Payer: Self-pay

## 2024-09-20 MED ORDER — TIZANIDINE HCL 4 MG PO TABS
4.0000 mg | ORAL_TABLET | Freq: Three times a day (TID) | ORAL | 0 refills | Status: DC | PRN
  Filled 2024-09-20: qty 30, 10d supply, fill #0

## 2024-09-22 NOTE — Progress Notes (Signed)
 Complex Care Management Care Guide Note  09/22/2024 Name: Lache Dagher MRN: [id number] DOB: Oct 16, 1966  Zamoria Boss is a 58 y.o. year old female who is a primary care patient of [name], NP and is actively engaged with the care management team. I reached out to Kelly Wilene Bunk by phone today to assist with re-scheduling  with the Pharmacist.  Follow up plan: Unsuccessful telephone outreach attempt made. A HIPAA compliant phone message was left for the patient providing contact information and requesting a return call.  Leotis Rase [facility], [facility] Guide  Direct Dial: [phone]  Fax [phone]

## 2024-09-29 ENCOUNTER — Other Ambulatory Visit: Payer: Self-pay

## 2024-10-03 ENCOUNTER — Other Ambulatory Visit: Payer: Self-pay

## 2024-10-10 ENCOUNTER — Other Ambulatory Visit: Payer: Self-pay

## 2024-10-13 ENCOUNTER — Ambulatory Visit (INDEPENDENT_AMBULATORY_CARE_PROVIDER_SITE_OTHER): Payer: Self-pay | Admitting: Nurse Practitioner

## 2024-10-13 ENCOUNTER — Other Ambulatory Visit: Payer: Self-pay

## 2024-10-13 ENCOUNTER — Encounter: Payer: Self-pay | Admitting: Nurse Practitioner

## 2024-10-13 VITALS — BP 141/70 | HR 93 | Wt 144.4 lb

## 2024-10-13 DIAGNOSIS — M79642 Pain in left hand: Secondary | ICD-10-CM

## 2024-10-13 DIAGNOSIS — Z13220 Encounter for screening for lipoid disorders: Secondary | ICD-10-CM

## 2024-10-13 DIAGNOSIS — E1140 Type 2 diabetes mellitus with diabetic neuropathy, unspecified: Secondary | ICD-10-CM | POA: Diagnosis not present

## 2024-10-13 DIAGNOSIS — M79641 Pain in right hand: Secondary | ICD-10-CM

## 2024-10-13 LAB — POCT GLYCOSYLATED HEMOGLOBIN (HGB A1C): Hemoglobin A1C: 8.5 % — AB (ref 4.0–5.6)

## 2024-10-13 MED ORDER — AMITRIPTYLINE HCL 75 MG PO TABS
75.0000 mg | ORAL_TABLET | Freq: Every day | ORAL | 3 refills | Status: AC
  Filled 2024-10-13 – 2024-11-16 (×2): qty 90, 90d supply, fill #0
  Filled 2024-11-16: qty 30, 30d supply, fill #0

## 2024-10-13 MED ORDER — OMEPRAZOLE 20 MG PO CPDR
20.0000 mg | DELAYED_RELEASE_CAPSULE | Freq: Every day | ORAL | 3 refills | Status: AC
  Filled 2024-10-13: qty 30, 30d supply, fill #0
  Filled 2024-11-16: qty 30, 30d supply, fill #1

## 2024-10-13 MED ORDER — OMEPRAZOLE 20 MG PO CPDR
20.0000 mg | DELAYED_RELEASE_CAPSULE | Freq: Every day | ORAL | 3 refills | Status: DC
  Filled 2024-10-13: qty 30, 30d supply, fill #0

## 2024-10-13 NOTE — Progress Notes (Signed)
 Subjective   Patient ID: Shelley Bartlett, female    DOB: 1965-12-14, 58 y.o.   MRN: [id number]  Chief Complaint  Patient presents with   Diabetes    Last A1C was 8.2 on 07/14/2024   Medication Management    Would like to try another muscle relaxer, tizanidine  was ineffective     Referring provider: [name], NP  Shelley Bartlett is a 58 y.o. female with Past Medical History: No date: Diabetes (HCC) No date: Goiter   HPI   58 year old female with history of diabetes and Goiter.    Patient presents today for follow-up visit.  Overall she has been doing well.  Patient does have a daughter with her in office today that does speak English.  A1c in office today is 8.5.  Patient has been followed by pharmacy for medication management since last visit here.  Diabetic diet advised and exercise. Denies f/c/s, n/v/d, hemoptysis, PND, leg swelling Denies chest pain or edema.  Bilateral hand pain. Having a hard time grasping objects and opening and closing hands. Referral has been placed to hand specialist.   Chronic cough at night. Will trial omeprazole.    Allergies[1]  Immunization History  Administered Date(s) Administered   Hepatitis A, Adult 09/19/2020   Hepatitis B 07/11/2019   Influenza,inj,Quad PF,6+ Mos 09/19/2020, 10/14/2020, 09/08/2021   MMR 07/11/2019   Td 07/11/2019   Varicella 09/19/2020    Tobacco History: Tobacco Use History[2] Counseling given: Not Answered   Outpatient Encounter Medications as of 10/13/2024  Medication Sig   Accu-Chek Softclix Lancets lancets Use as instructed to check blood sugar twice daily.   Blood Glucose Monitoring Suppl DEVI Use as directed in the morning, at noon, and at bedtime.   Dulaglutide  (TRULICITY ) 3 MG/0.5ML SOAJ Inject 3 mg as directed once a week.   glucose blood (ACCU-CHEK GUIDE TEST) test strip Use as instructed to monitor blood sugar twice daily   insulin  glargine (LANTUS ) 100 UNIT/ML Solostar Pen  Inject 12 Units into the skin daily. May inject up to 20 units daily if instructed by your provider.   Insulin  Pen Needle (PEN NEEDLES) 32G X 4 MM MISC Use as directed to inject insulin  once daily.   polyethylene glycol powder (MIRALAX ) 17 GM/SCOOP powder Take 17 grams by mouth every other day. May increase to once daily if needed.   tiZANidine  (ZANAFLEX ) 4 MG tablet Take 1 tablet (4 mg total) by mouth every 8 (eight) hours as needed for muscle spasms.   [DISCONTINUED] amitriptyline  (ELAVIL ) 75 MG tablet Take 1 tablet (75 mg total) by mouth at bedtime.   [DISCONTINUED] omeprazole (PRILOSEC) 20 MG capsule Take 1 capsule (20 mg total) by mouth daily.   amitriptyline  (ELAVIL ) 75 MG tablet Take 1 tablet (75 mg total) by mouth at bedtime.   omeprazole (PRILOSEC) 20 MG capsule Take 1 capsule (20 mg total) by mouth daily.   No facility-administered encounter medications on file as of 10/13/2024.    Review of Systems  Review of Systems  Constitutional: Negative.   HENT: Negative.    Cardiovascular: Negative.   Gastrointestinal: Negative.   Allergic/Immunologic: Negative.   Neurological: Negative.   Psychiatric/Behavioral: Negative.       Objective:   BP (!) 141/70 (BP Location: Left Arm, Patient Position: Sitting, Cuff Size: Large)   Pulse 93   Wt 144 lb 6.4 oz (65.5 kg)   SpO2 98%   BMI 28.68 kg/m   Wt Readings from Last 5 Encounters:  10/13/24  144 lb 6.4 oz (65.5 kg)  07/14/24 143 lb (64.9 kg)  05/23/24 141 lb (64 kg)  03/16/24 144 lb (65.3 kg)  11/16/22 144 lb (65.3 kg)     Physical Exam Vitals and nursing note reviewed.  Constitutional:      General: She is not in acute distress.    Appearance: She is well-developed.  Cardiovascular:     Rate and Rhythm: Normal rate and regular rhythm.  Pulmonary:     Effort: Pulmonary effort is normal.     Breath sounds: Normal breath sounds.  Neurological:     Mental Status: She is alert and oriented to person, place, and time.        Assessment & Plan:   Type 2 diabetes mellitus with diabetic neuropathy, without long-term current use of insulin  (HCC) -     Lipid panel -     POCT glycosylated hemoglobin (Hb A1C) -     AMB Referral [facility] Care Management  Lipid screening -     Lipid panel -     CBC -     Comprehensive metabolic panel with GFR  Bilateral hand pain -     Ambulatory referral to Hand Surgery  Other orders -     Omeprazole; Take 1 capsule (20 mg total) by mouth daily.  Dispense: 30 capsule; Refill: 3 -     Amitriptyline  HCl; Take 1 tablet (75 mg total) by mouth at bedtime.  Dispense: 90 tablet; Refill: 3     Return in about 3 months (around 01/11/2025).   Bascom GORMAN Borer, NP 10/13/2024     [1] No Known Allergies [2]  Social History Tobacco Use  Smoking Status Never  Smokeless Tobacco Never

## 2024-10-14 LAB — CBC
Hematocrit: 39.5 % (ref 34.0–46.6)
Hemoglobin: 12.8 g/dL (ref 11.1–15.9)
MCH: 28.1 pg (ref 26.6–33.0)
MCHC: 32.4 g/dL (ref 31.5–35.7)
MCV: 87 fL (ref 79–97)
Platelets: 180 x10E3/uL (ref 150–450)
RBC: 4.55 x10E6/uL (ref 3.77–5.28)
RDW: 13.7 % (ref 11.7–15.4)
WBC: 5.3 x10E3/uL (ref 3.4–10.8)

## 2024-10-14 LAB — COMPREHENSIVE METABOLIC PANEL WITH GFR
ALT: 20 IU/L (ref 0–32)
AST: 14 IU/L (ref 0–40)
Albumin: 4 g/dL (ref 3.8–4.9)
Alkaline Phosphatase: 76 IU/L (ref 49–135)
BUN/Creatinine Ratio: 12 (ref 9–23)
BUN: 9 mg/dL (ref 6–24)
Bilirubin Total: 0.3 mg/dL (ref 0.0–1.2)
CO2: 22 mmol/L (ref 20–29)
Calcium: 9 mg/dL (ref 8.7–10.2)
Chloride: 104 mmol/L (ref 96–106)
Creatinine, Ser: 0.76 mg/dL (ref 0.57–1.00)
Globulin, Total: 2.5 g/dL (ref 1.5–4.5)
Glucose: 118 mg/dL — ABNORMAL HIGH (ref 70–99)
Potassium: 4.2 mmol/L (ref 3.5–5.2)
Sodium: 139 mmol/L (ref 134–144)
Total Protein: 6.5 g/dL (ref 6.0–8.5)
eGFR: 91 mL/min/1.73 (ref >59)

## 2024-10-14 LAB — LIPID PANEL
Chol/HDL Ratio: 3.9 ratio (ref 0.0–4.4)
Cholesterol, Total: 188 mg/dL (ref 100–199)
HDL: 48 mg/dL (ref >39)
LDL Chol Calc (NIH): 124 mg/dL — ABNORMAL HIGH (ref 0–99)
Triglycerides: 90 mg/dL (ref 0–149)
VLDL Cholesterol Cal: 16 mg/dL (ref 5–40)

## 2024-10-16 ENCOUNTER — Ambulatory Visit: Payer: Self-pay | Admitting: Nurse Practitioner

## 2024-10-19 ENCOUNTER — Other Ambulatory Visit: Payer: Self-pay

## 2024-10-23 ENCOUNTER — Other Ambulatory Visit: Payer: Self-pay | Admitting: Nurse Practitioner

## 2024-10-23 ENCOUNTER — Other Ambulatory Visit: Payer: Self-pay

## 2024-10-23 MED ORDER — TIZANIDINE HCL 4 MG PO TABS
4.0000 mg | ORAL_TABLET | Freq: Three times a day (TID) | ORAL | 0 refills | Status: DC | PRN
  Filled 2024-10-23: qty 30, 10d supply, fill #0

## 2024-10-24 ENCOUNTER — Other Ambulatory Visit: Payer: Self-pay

## 2024-11-03 ENCOUNTER — Other Ambulatory Visit: Payer: Self-pay

## 2024-11-10 ENCOUNTER — Other Ambulatory Visit: Payer: Self-pay

## 2024-11-16 ENCOUNTER — Other Ambulatory Visit: Payer: Self-pay

## 2024-11-16 ENCOUNTER — Other Ambulatory Visit: Payer: Self-pay | Admitting: Nurse Practitioner

## 2024-11-16 MED ORDER — TIZANIDINE HCL 4 MG PO TABS
4.0000 mg | ORAL_TABLET | Freq: Three times a day (TID) | ORAL | 0 refills | Status: AC | PRN
  Filled 2024-11-16: qty 30, 10d supply, fill #0

## 2024-11-16 NOTE — Telephone Encounter (Signed)
 Please advise [facility]

## 2024-11-17 ENCOUNTER — Other Ambulatory Visit: Payer: Self-pay

## 2024-11-22 ENCOUNTER — Ambulatory Visit: Payer: Self-pay

## 2024-11-22 NOTE — Progress Notes (Unsigned)
 "  11/22/2024 Name: Shelley Bartlett MRN: [id number] DOB: 1966/08/26  No chief complaint on file.   Shelley Bartlett is a 59 y.o. year old female who was referred for medication management by their primary care provider, [name], NP. They presented for a face to face visit today. Patient is accompanied by a [facility] interpreter.    They were referred to the pharmacist by their PCP for assistance in managing diabetes. PMH includes T2DM, thyroid nodule/goiter, neuropathy, back pain.   Subjective: Patient was last seen by PCP, [name], NP on 03/16/24. A1C in office was 12.5%. Patient ran out of Trulicity  1.5 mg 1 mo prior. She was instructed to restart Trulicity  at 3 mg weekly. At last pharmacy appt in-person on 04/20/24, patient reported doing ok. She brought her medications with her (amitriptyline  and Trulicity  3 mg). She revealed that she had not been keeping Trulicity  in the fridge. She was instructed to start basal insulin  at 10 units daily and start monitoring her BG.   Today, patient reports doing ok. She brings her BG log with her. She has been taking her Lantus  and Trulicity  as prescribed. She ran out of amitriptyline  last night and needs a refill.   Statin CGM study?? Would need to recheck A1C today  Care Team: Primary Care Provider: [name], NP ; Next Scheduled Visit: 06/19/24.  Medication Access/Adherence  Current Pharmacy:  [facility] [address], [address] [place] [address] Phone: [phone] Fax: [phone]   Patient reports affordability concerns with their medications: No  - patient is using [facility] Patient reports access/transportation concerns to their pharmacy: No  Patient reports adherence concerns with their medications:  No     Diabetes:  Current medications: Trulicity  3 mg once weekly, Lantus  10 units once daily (son reports that they have missed ~1x dose, and  have not consistently been taking exactly 24 hours apart) Medications tried in the past: metformin  XR - GI AE  Denies GI AE including nausea, vomiting. She does report bothersome constipation. She has not tried anything. She is willing to try Miralax .   Current glucose readings: patient brings written log review. She is testing once daily fasting. Reports this is ~1PM (before her first meal of the day). After this she usually will have juice (which she reports is zero sugar) or milk.  03/28/24: 275 mg/dL 3/83/74: 744 mg/dL 3/81/74: 706 mg/dL 3/80/74: 758 mg/dL 3/79/74: 804 mg/dL 3/78/74: 789 mg/dL 3/77/74: 805 mg/dL 3/76/74: 815 mg/dL 3/75/74: 809 mg/dL 3/74/74: 810 mg/dL 3/73/74: 821 mg/dL 3/72/74: 801 mg/dL  ---------: 817 mg/dL  ---------: 839 mg/dL  ---------: 822 mg/dL  ---------: 833 mg/dL  ---------: 832 mg/dL  ---------: 819 mg/dL  ---------: 814 mg/dL  ---------: 822 mg/dL  ---------: 819 mg/dL  ---------: 807 mg/dL  ---------: 839 mg/dL  ---------: 849 mg/dL  ---------: 849 mg/dL  ---------: 837 mg/dL    Patient denies hypoglycemic s/sx including dizziness, shakiness, sweating. Patient denies hyperglycemic symptoms including polydipsia, polyphagia, blurred vision. Reports stable neuropathy (which is treated with amitriptyline )  Current meal patterns: Eats 3 times per day. Endorses that she does eat a lot of carbohydrate foods like bread. She has not attempted to cut back on fufu since our last discussion.  - Breakfast: fufu and fruit - Lunch: did not discuss in detail - Supper: did not discuss in detail - Snacks - fruit - Drinks - zero sugar juice (passion juice - reports  this is zero sugar, but she will check the label),   Current physical activity: Reports that she walks  Current medication access support: none - BCBS + Medicaid  Hyperlipidemia/ASCVD Risk Reduction  Current lipid lowering medications: none Medications tried in  the past: atorvastatin  - dizziness  Clinical ASCVD: No  The 10-year ASCVD risk score (Arnett DK, et al., 2019) is: 5.9%   Values used to calculate the score:     Age: 24 years     Clinically relevant sex: Female     Is Non-Hispanic African American: No     Diabetic: Yes     Tobacco smoker: No     Systolic Blood Pressure: 141 mmHg     Is BP treated: No     HDL Cholesterol: 48 mg/dL     Total Cholesterol: 188 mg/dL    Objective: BP Readings from Last 3 Encounters:  10/13/24 (!) 141/70  07/14/24 139/83  05/23/24 128/72   Lab Results  Component Value Date   HGBA1C 8.5 (A) 10/13/2024   HGBA1C 8.2 (A) 07/14/2024   HGBA1C 12.5 (A) 03/16/2024    UACR 03/16/24: 221 mg/g (increased rom 70 mg/g in 2023)  Lab Results  Component Value Date   CREATININE 0.76 10/13/2024   BUN 9 10/13/2024   NA 139 10/13/2024   K 4.2 10/13/2024   CL 104 10/13/2024   CO2 22 10/13/2024    Lab Results  Component Value Date   CHOL 188 10/13/2024   HDL 48 10/13/2024   LDLCALC 124 (H) 10/13/2024   TRIG 90 10/13/2024   CHOLHDL 3.9 10/13/2024    Medications Reviewed Today   Medications were not reviewed in this encounter       Assessment/Plan:   Diabetes: - Currently uncontrolled with last A1C of 12.5% uncontrolled above goal < 7%, but improving since restarting insulin  and Trulicity  per SMBG data. Discussed that patient would be a good candidate for SGLT2i, now that her glycemic control has improved. However, patient would prefer to titrate insulin  rather than start an oral medication. She does have alternative indication for SGLT2i with last UACR of 221 mg/g - so will plan to re-discuss in the future. - Reviewed long term cardiovascular and renal outcomes of uncontrolled blood sugar - Reviewed hypoglycemia management and the rule of 15. - Reviewed goal A1c, goal fasting, and goal 2 hour post prandial glucose - Reviewed dietary modifications including reducing portion size of carbohydrate  foods and eating protein with each meal.Also discussed increasing water intake and minimizing sugary beverages. Patient and son were provided with planning healthy meals handout.  - Recommend to continue Trulicity  3 mg weekly - Recommend to increase basal insulin  to 12 units once daily. Re-educated to keep doses ~24 hours apart at a time of day that is convenient for her. - Recommend to check glucose once daily fasting and occasional 2-hr PPG. Discussed importance of daily monitoring with starting insulin .    Hyperlipidemia/ASCVD Risk Reduction: - Currently uncontrolled with last LDL-C of 102 mg/dL above goal < 70 mg/dL given U7IF. Moderate intensity statin indicated. Patient is hesitant to take additional oral medications. 10-year ASCVD risk score is low, so ok to hold off for now - but will plan to re-discuss importance of ASCVD prevention in the future.    Collaborated with provider in clinic to refill amitriptyline  for sleep and neuropathy.    Follow Up Plan:  PCP 06/19/24, PharmD in-person 07/19/24   Lorain Baseman, PharmD [facility] [phone]  "

## 2024-12-05 ENCOUNTER — Other Ambulatory Visit: Payer: Self-pay

## 2024-12-05 ENCOUNTER — Other Ambulatory Visit: Payer: Self-pay | Admitting: Nurse Practitioner

## 2024-12-05 ENCOUNTER — Telehealth: Payer: Self-pay | Admitting: *Deleted

## 2024-12-05 DIAGNOSIS — E1140 Type 2 diabetes mellitus with diabetic neuropathy, unspecified: Secondary | ICD-10-CM

## 2024-12-05 NOTE — Progress Notes (Signed)
 Complex Care Management Care Guide Note  12/05/2024 Name: Shelley Bartlett MRN: [id number] DOB: [birth year]  Shelley Bartlett is a 59 y.o. year old female who is a primary care patient of [name], NP and is actively engaged with the care management team. I reached out to Kelly Wilene Bunk by phone today to assist with re-scheduling  with the Pharmacist.  Follow up plan: Face to Face appointment with complex care management team member scheduled for:  01/23/25 at 1030 am  Harlene Satterfield  [facility] Guide  Direct Dial: [phone]  Fax [phone]

## 2024-12-06 ENCOUNTER — Other Ambulatory Visit: Payer: Self-pay

## 2024-12-06 MED ORDER — LANTUS SOLOSTAR 100 UNIT/ML ~~LOC~~ SOPN
12.0000 [IU] | PEN_INJECTOR | Freq: Every day | SUBCUTANEOUS | 5 refills | Status: AC
  Filled 2024-12-06: qty 6, 30d supply, fill #0

## 2024-12-06 NOTE — Telephone Encounter (Signed)
 Pt has no insurance. What are your thoughts on changing the insulin  to Basaglar  per pharmacy request?

## 2025-01-11 ENCOUNTER — Ambulatory Visit: Payer: Self-pay | Admitting: Nurse Practitioner

## 2025-01-23 ENCOUNTER — Ambulatory Visit: Payer: Self-pay
